# Patient Record
Sex: Male | Born: 1985 | Race: White | Hispanic: No | Marital: Single | Smoking: Current every day smoker
Health system: Southern US, Community
[De-identification: ages and names within clinical notes are randomized; demographics above are authoritative.]

## PROBLEM LIST (undated history)

## (undated) DIAGNOSIS — B019 Varicella without complication: Secondary | ICD-10-CM

## (undated) DIAGNOSIS — F17200 Nicotine dependence, unspecified, uncomplicated: Secondary | ICD-10-CM

## (undated) DIAGNOSIS — F1910 Other psychoactive substance abuse, uncomplicated: Secondary | ICD-10-CM

## (undated) DIAGNOSIS — F319 Bipolar disorder, unspecified: Secondary | ICD-10-CM

## (undated) DIAGNOSIS — R55 Syncope and collapse: Secondary | ICD-10-CM

## (undated) DIAGNOSIS — F329 Major depressive disorder, single episode, unspecified: Secondary | ICD-10-CM

## (undated) DIAGNOSIS — N529 Male erectile dysfunction, unspecified: Secondary | ICD-10-CM

## (undated) DIAGNOSIS — J209 Acute bronchitis, unspecified: Secondary | ICD-10-CM

## (undated) DIAGNOSIS — M549 Dorsalgia, unspecified: Secondary | ICD-10-CM

## (undated) DIAGNOSIS — G8929 Other chronic pain: Secondary | ICD-10-CM

## (undated) DIAGNOSIS — F32A Depression, unspecified: Secondary | ICD-10-CM

## (undated) HISTORY — PX: WISDOM TOOTH EXTRACTION: SHX21

## (undated) HISTORY — DX: Depression, unspecified: F32.A

## (undated) HISTORY — DX: Other psychoactive substance abuse, uncomplicated: F19.10

## (undated) HISTORY — DX: Other chronic pain: G89.29

## (undated) HISTORY — DX: Bipolar disorder, unspecified: F31.9

## (undated) HISTORY — DX: Major depressive disorder, single episode, unspecified: F32.9

## (undated) HISTORY — DX: Male erectile dysfunction, unspecified: N52.9

## (undated) HISTORY — DX: Nicotine dependence, unspecified, uncomplicated: F17.200

## (undated) HISTORY — DX: Varicella without complication: B01.9

## (undated) HISTORY — DX: Syncope and collapse: R55

## (undated) HISTORY — DX: Acute bronchitis, unspecified: J20.9

## (undated) HISTORY — DX: Dorsalgia, unspecified: M54.9

---
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
## 2007-08-10 ENCOUNTER — Emergency Department (HOSPITAL_COMMUNITY): Admission: EM | Admit: 2007-08-10 | Discharge: 2007-08-10 | Payer: Self-pay | Admitting: Family Medicine

## 2008-09-02 ENCOUNTER — Emergency Department (HOSPITAL_COMMUNITY): Admission: EM | Admit: 2008-09-02 | Discharge: 2008-09-03 | Payer: Self-pay | Admitting: Emergency Medicine

## 2009-03-25 ENCOUNTER — Ambulatory Visit: Payer: Self-pay | Admitting: Internal Medicine

## 2009-03-25 DIAGNOSIS — R55 Syncope and collapse: Secondary | ICD-10-CM

## 2009-03-25 DIAGNOSIS — F528 Other sexual dysfunction not due to a substance or known physiological condition: Secondary | ICD-10-CM

## 2009-03-25 DIAGNOSIS — F319 Bipolar disorder, unspecified: Secondary | ICD-10-CM | POA: Insufficient documentation

## 2009-03-25 DIAGNOSIS — F17200 Nicotine dependence, unspecified, uncomplicated: Secondary | ICD-10-CM

## 2009-03-28 ENCOUNTER — Encounter: Payer: Self-pay | Admitting: Internal Medicine

## 2009-04-08 DIAGNOSIS — F329 Major depressive disorder, single episode, unspecified: Secondary | ICD-10-CM

## 2009-04-08 DIAGNOSIS — F489 Nonpsychotic mental disorder, unspecified: Secondary | ICD-10-CM | POA: Insufficient documentation

## 2009-05-05 ENCOUNTER — Ambulatory Visit: Payer: Self-pay | Admitting: Internal Medicine

## 2009-05-18 ENCOUNTER — Ambulatory Visit: Payer: Self-pay

## 2009-05-18 ENCOUNTER — Encounter: Payer: Self-pay | Admitting: Internal Medicine

## 2009-06-30 ENCOUNTER — Encounter: Payer: Self-pay | Admitting: Internal Medicine

## 2009-07-11 ENCOUNTER — Ambulatory Visit: Payer: Self-pay | Admitting: Internal Medicine

## 2009-07-11 ENCOUNTER — Ambulatory Visit: Payer: Self-pay

## 2009-09-08 ENCOUNTER — Ambulatory Visit: Payer: Self-pay | Admitting: Internal Medicine

## 2010-12-31 LAB — CONVERTED CEMR LAB
ALT: 13 units/L (ref 0–53)
AST: 15 units/L (ref 0–37)
Albumin: 4.4 g/dL (ref 3.5–5.2)
Alkaline Phosphatase: 60 units/L (ref 39–117)
BUN: 16 mg/dL (ref 6–23)
Basophils Absolute: 0 10*3/uL (ref 0.0–0.1)
Basophils Relative: 0 % (ref 0.0–3.0)
Bilirubin, Direct: 0.1 mg/dL (ref 0.0–0.3)
CO2: 31 meq/L (ref 19–32)
Calcium: 9.7 mg/dL (ref 8.4–10.5)
Chloride: 108 meq/L (ref 96–112)
Creatinine, Ser: 1.1 mg/dL (ref 0.4–1.5)
Eosinophils Absolute: 0.1 10*3/uL (ref 0.0–0.7)
Eosinophils Relative: 1.7 % (ref 0.0–5.0)
Ferritin: 51.8 ng/mL (ref 22.0–322.0)
Free T4: 0.8 ng/dL (ref 0.6–1.6)
GFR calc non Af Amer: 90.22 mL/min (ref >60)
Glucose, Bld: 99 mg/dL (ref 70–99)
HCT: 45.9 % (ref 39.0–52.0)
Hemoglobin: 15.9 g/dL (ref 13.0–17.0)
Iron: 63 ug/dL (ref 42–165)
Lymphocytes Relative: 26.6 % (ref 12.0–46.0)
Lymphs Abs: 1.8 10*3/uL (ref 0.7–4.0)
MCHC: 34.6 g/dL (ref 30.0–36.0)
MCV: 97.5 fL (ref 78.0–100.0)
Monocytes Absolute: 0.5 10*3/uL (ref 0.1–1.0)
Monocytes Relative: 7.4 % (ref 3.0–12.0)
Neutro Abs: 4.3 10*3/uL (ref 1.4–7.7)
Neutrophils Relative %: 64.3 % (ref 43.0–77.0)
Platelets: 102 10*3/uL — ABNORMAL LOW (ref 150.0–400.0)
Potassium: 4.4 meq/L (ref 3.5–5.1)
Prolactin: 4.3 ng/mL (ref 2.1–17.1)
RBC: 4.71 M/uL (ref 4.22–5.81)
RDW: 13 % (ref 11.5–14.6)
Saturation Ratios: 18.4 % — ABNORMAL LOW (ref 20.0–50.0)
Sex Hormone Binding: 28 nmol/L (ref 13–71)
Sodium: 143 meq/L (ref 135–145)
TSH: 1.28 microintl units/mL (ref 0.35–5.50)
Testosterone Free: 180.7 pg/mL (ref 47.0–244.0)
Testosterone-% Free: 2.5 % (ref 1.6–2.9)
Testosterone: 676.54 ng/dL (ref 350.00–890.00)
Testosterone: 734.92 ng/dL (ref 350–890)
Total Bilirubin: 0.5 mg/dL (ref 0.3–1.2)
Total Protein: 6.4 g/dL (ref 6.0–8.3)
Transferrin: 244.8 mg/dL (ref 212.0–360.0)
WBC: 6.7 10*3/uL (ref 4.5–10.5)

## 2011-02-28 ENCOUNTER — Ambulatory Visit: Payer: Self-pay | Admitting: Internal Medicine

## 2011-02-28 DIAGNOSIS — Z0289 Encounter for other administrative examinations: Secondary | ICD-10-CM

## 2011-04-20 NOTE — H&P (Signed)
NAMEGREGREY, BLOYD                            ACCOUNT NO.:  [id number]   MEDICAL RECORD NO.:  [id number]                   PATIENT TYPE:  INP   LOCATION:  0204                                 FACILITY:  [facility]   PHYSICIAN:  Cindie Crumbly, M.D.               DATE OF BIRTH:  05/20/1986   DATE OF ADMISSION:  12/07/2002  DATE OF DISCHARGE:                         PSYCHIATRIC ADMISSION ASSESSMENT   REASON FOR ADMISSION:  This 25 year old white male was admitted complaining  of depression with suicidal ideation with an attempt to kill himself by  cutting his throat with a knife.   HISTORY OF PRESENT ILLNESS:  The patient complains with increasingly  depressed, irritable and angry mood most of the day nearly everyday over the  past several months, along with anhedonia, giving up on activities  previously found pleasurable, decreased school performance, decreased  concentration, energy level, increases symptoms of fatigue, feelings  hopelessness, helplessness, worthlessness, psychomotor agitation, access ive  and inappropriate guilt, giving up on activities previously enjoyed,  insomnia, decreased appetite, recurrent thoughts of death.  He is unable to  contract for safety at this time.   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  He has a history of being seen when in the forth grade at the [facility] for attention deficit hyperactivity disorder and conduct disorder. He  has had no psychiatric treatment since that time.   DRUG ALCOHOL ABUSE HISTORY:  His drug and alcohol abuse history is  significant for the patient reportedly using alcohol at least once a month.  He reports using cannabis 1-2 times per week since age 35.  He reports  smoking approximately a pack of cigarettes per week.   PAST MEDICAL HISTORY:  Significant for acne vulgaris and a history of mild  scoliosis, which has required no intervention or treatment.   ALLERGIES:  He has a history of being allergic to CECLOR.   CURRENT MEDICATIONS:  He is on no current medications.   STRENGTHS AND ASSETS:  His strengths and assets are that he is well  connected into social services.   FAMILY AND SOCIAL HISTORY:  The patient lives with grandparents most of his  life.  He reports that he is no longer getting along well.  His grandfather  has told him that he hates him and he is not sure that he will be able to  return to his grandparents household.  Mother and father have a history of  bipolar disorder.  Father has a history of polysubstance dependence.  Mother's bipolar disorder is poorly controlled and she was recently  hospitalized at the [facility] for stabilization.  The patient  is currently in 9th grade and doing poorly with his grades.   MENTAL STATUS EXAM:  The patient presents as a well-developed, well-  nourished, adolescent, white male who is alert, oriented x4, psychomotor  agitation, and his appearance is compatible with his stated age.  He speech  is coherent with a decreased rate in volume, speech increase, speech  latency.  He displays no lucency of association, phonemic errors or evidence  of a thought disorder. His concentration is decreased. He displays poor  impulse control.  He remains in denial of his chemical dependency issues.  His affect and mood are depressed, irritable and angry. He is oppositional  and defiant.  His immediate recall short-term memory and remote memory are  intact.  Similarities and differences are within normal limits.  His  proverbs are somewhat concrete and consistent with his educational level and  development. Thought processes are goal directed.   DIAGNOSES:  His diagnoses according to DSM-IV:   AXIS I:  1. Major depression, single episode, severe without psychosis.  2. Rule out substance abuse and mood disorder.  3.  Polysubstance dependence.  4. Conduct disorder.  5. Attention deficit hyperactivity disorder, combined type.   AXIS II:  1. Rule out learning disorder, not otherwise specified.  2. Rule out personality disorder, not otherwise specified.   AXIS III:  1. Acne vulgaris.  2. Mild scoliosis.   AXIS IV:  Current psychosocial stressors are severe.   AXIS V:  Code 20.   FURTHER EVALUATION AND TREATMENT RECOMMENDATIONS:  The estimated length of  stay for the patient on the inpatient unit is 5-7 days.  Initial discharge  plan is to discharge patient to home.  Initial plan of care is begin the  patient on a trial of Wellbutrin XL.  Psychotherapy will focus on improving  the patient's impulse control, decrease in cognitive distortions and  potential for self-harm.  A laboratory work-up will also be initiated to  rule out any other medical problems contributing to his symptomatology.                                                   Cindie Crumbly, M.D.    TS/MEDQ  D:  12/08/2002  T:  12/08/2002  Job:  [id number]

## 2011-04-20 NOTE — Discharge Summary (Signed)
NAMEHOWELL, Alfred Woodard                            ACCOUNT NO.:  [id number]   MEDICAL RECORD NO.:  [id number]                   PATIENT TYPE:  INP   LOCATION:  0204                                 FACILITY:  [facility]   PHYSICIAN:  Cindie Crumbly, M.D.               DATE OF BIRTH:  1986-04-03   DATE OF ADMISSION:  12/07/2002  DATE OF DISCHARGE:                                 DISCHARGE SUMMARY   REASON FOR ADMISSION:  This 25 year old white male was admitted complaining  of depression with suicidal ideation with an attempt to kill himself by  cutting his throat with a knife.  For further history of present illness,  please see the patient's psychiatry admission assessment.   PHYSICAL EXAMINATION:  At the time of admission was significant for acne  vulgaris and mild scoliosis which required no intervention.  He had an  otherwise unremarkable physical examination.   LABORATORY EXAMINATION:  The patient underwent a laboratory workup to rule  out any other medical problems contributing to his symptomatology.  Urine  drug screen was significant for metabolites of marijuana and was otherwise  negative.  A urine probe for gonorrhea and chlamydia were negative.  CBC was  unremarkable.  A UA was unremarkable.  The patient received no x-rays, no  special procedures, no additional consultations.  He sustained no  complications during the course of this hospitalization.   HOSPITAL COURSE:  On admission, the patient was psychomotor agitated with  decreased concentration, showed poor impulse control.  He remained in denial  of his chemical dependency issues.  His affect and mood were depressed,  irritable and angry.  He was oppositional and defiant.  He gradually began  addressing some of his chemical dependency issues in psychotherapy.  He was  begun on a trial of Wellbutrin XL and tolerated this medication well without  side effects.  At the time of discharge, he denies any homicidal or suicidal  ideation.  His affect and mood have improved.  His concentration has  increased.  He remains in denial of his chemical dependency problem but his  affect and mood have improved.  He no longer appears to be a danger to  himself or others, is motivated for outpatient therapy  and consequently is  felt to have reached his maximum benefits of hospitalization and is ready  for discharge to a less restricted alternative setting.   CONDITION ON DISCHARGE:  Improved.   DISCHARGE DIAGNOSES:   AXIS I:  1. Major depression, single episode, severe, without psychosis.  2. Rule out substance-induced mood disorder.  3. Polysubstance dependence.  4. Conduct disorder.  5. Attention deficit hyperactivity disorder, combined type.   AXIS II:  1. Rule out learning disorder not otherwise specified.  2. Rule out personality disorder not otherwise specified.   AXIS III:  1. Acne vulgaris.  2. Mild scoliosis.   AXIS IV:  Current  psychosocial stressors are severe.   AXIS V:  Code 20 on admission, code 30 on discharge.   FURTHER EVALUATION AND TREATMENT RECOMMENDATIONS:  1. The patient is discharged to home.  2. He is discharged on an unrestricted level of activity and a regular diet.  3. He will follow up with Dr. Mariana Single, his outpatient psychiatrist at [facility] for all further aspects of his psychiatric     care and consequently I will sign off on the case at this time.  He will     follow up with his primary care physician for all further aspects of his     medical care.   DISCHARGE MEDICATIONS:  Wellbutrin XL 150 mg p.o. q.a.m.                                                 Cindie Crumbly, M.D.    TS/MEDQ  D:  12/15/2002  T:  12/15/2002  Job:  [id number]

## 2011-08-12 ENCOUNTER — Emergency Department (HOSPITAL_COMMUNITY)
Admission: EM | Admit: 2011-08-12 | Discharge: 2011-08-13 | Disposition: A | Discharge status: Home / Self Care | Payer: BC Managed Care – PPO | Source: Home / Self Care | Attending: Emergency Medicine | Admitting: Emergency Medicine

## 2011-08-13 ENCOUNTER — Emergency Department (HOSPITAL_COMMUNITY)
Admit: 2011-08-13 | Discharge: 2011-08-13 | Disposition: A | Discharge status: Home / Self Care | Payer: BC Managed Care – PPO | Attending: Emergency Medicine | Admitting: Emergency Medicine

## 2011-08-13 DIAGNOSIS — S139XXA Sprain of joints and ligaments of unspecified parts of neck, initial encounter: Secondary | ICD-10-CM | POA: Insufficient documentation

## 2011-08-13 DIAGNOSIS — M542 Cervicalgia: Secondary | ICD-10-CM | POA: Insufficient documentation

## 2011-08-13 DIAGNOSIS — F411 Generalized anxiety disorder: Secondary | ICD-10-CM | POA: Insufficient documentation

## 2011-08-13 DIAGNOSIS — IMO0002 Reserved for concepts with insufficient information to code with codable children: Secondary | ICD-10-CM | POA: Insufficient documentation

## 2011-08-13 DIAGNOSIS — G8929 Other chronic pain: Secondary | ICD-10-CM | POA: Insufficient documentation

## 2011-08-13 DIAGNOSIS — M549 Dorsalgia, unspecified: Secondary | ICD-10-CM | POA: Insufficient documentation

## 2011-09-03 LAB — RAPID URINE DRUG SCREEN, HOSP PERFORMED
Benzodiazepines: NOT DETECTED
Cocaine: NOT DETECTED
Opiates: NOT DETECTED
Tetrahydrocannabinol: POSITIVE — AB

## 2011-09-03 LAB — COMPREHENSIVE METABOLIC PANEL
ALT: 13
AST: 20
Albumin: 4.6
Alkaline Phosphatase: 61
Chloride: 106
GFR calc Af Amer: >60
Potassium: 3.9
Sodium: 140
Total Bilirubin: 0.6
Total Protein: 6.9

## 2011-09-03 LAB — DIFFERENTIAL
Basophils Absolute: 0
Basophils Relative: 0
Eosinophils Relative: 1
Monocytes Absolute: 0.5
Monocytes Relative: 7

## 2011-09-03 LAB — URINALYSIS, ROUTINE W REFLEX MICROSCOPIC
Hgb urine dipstick: NEGATIVE
Ketones, ur: NEGATIVE
Nitrite: NEGATIVE
Urobilinogen, UA: 0.2

## 2011-09-03 LAB — CBC
HCT: 46.1
Platelets: 155
WBC: 7.5

## 2011-09-03 LAB — URINE MICROSCOPIC-ADD ON

## 2011-09-03 LAB — ETHANOL: Alcohol, Ethyl (B): 40 — ABNORMAL HIGH

## 2012-02-13 ENCOUNTER — Ambulatory Visit: Payer: BC Managed Care – PPO | Admitting: Family Medicine

## 2012-02-13 VITALS — BP 123/72 | HR 108 | Temp 97.9°F | Resp 18 | Ht 71.25 in | Wt 147.4 lb

## 2012-02-13 DIAGNOSIS — M549 Dorsalgia, unspecified: Secondary | ICD-10-CM

## 2012-02-13 DIAGNOSIS — G8929 Other chronic pain: Secondary | ICD-10-CM

## 2012-02-13 DIAGNOSIS — J40 Bronchitis, not specified as acute or chronic: Secondary | ICD-10-CM

## 2012-02-13 MED ORDER — DOXYCYCLINE HYCLATE 100 MG PO TABS: 100.0000 mg | ORAL_TABLET | Freq: Two times a day (BID) | ORAL | Status: AC

## 2012-02-13 MED ORDER — CYCLOBENZAPRINE HCL 10 MG PO TABS: ORAL_TABLET | ORAL | Status: DC

## 2012-02-13 MED ORDER — HYDROCODONE-HOMATROPINE 5-1.5 MG/5ML PO SYRP: 5.0000 mL | ORAL_SOLUTION | Freq: Three times a day (TID) | ORAL | Status: AC | PRN

## 2012-02-13 NOTE — Patient Instructions (Signed)
See. back exercise booklet.  Treatment planning of fluids, get enough rest. Stop smoking.

## 2012-02-13 NOTE — Progress Notes (Signed)
Subjective: This patient has had a respiratory infection for over 6 days. The last couple of days at work he's been coughing. Yesterday the cough was worse to terrible, too he almost passed out. He did better overnight last night and when he got back into work today. There is a lot of metal fragments and dust in the air where he works. His boss told him to come see the doctor.  The cough has also lost passing in the muscles of his back. However he has had a several year history of hurting his back, mid back, and he wondered whether he had anything out of alignment.  Objective: TMs normal. Throat clear. Neck supple without nodes. Chest clear to auscultation. Heart regular without murmurs. Spine looks fine. Normal flexion and extension and rotation.  Assessment: Arthritis Back strain, chronic  Plans antibiotic, cough medicine, and muscle relaxants.

## 2012-07-11 ENCOUNTER — Encounter (HOSPITAL_COMMUNITY): Payer: Self-pay

## 2012-07-11 ENCOUNTER — Emergency Department (INDEPENDENT_AMBULATORY_CARE_PROVIDER_SITE_OTHER)
Admission: EM | Admit: 2012-07-11 | Discharge: 2012-07-11 | Disposition: A | Discharge status: Home / Self Care | Payer: BC Managed Care – PPO | Source: Home / Self Care | Attending: Family Medicine | Admitting: Family Medicine

## 2012-07-11 DIAGNOSIS — F411 Generalized anxiety disorder: Secondary | ICD-10-CM

## 2012-07-11 MED ORDER — CLONAZEPAM 1 MG PO TABS: 1.0000 mg | ORAL_TABLET | Freq: Two times a day (BID) | ORAL | Status: DC | PRN

## 2012-07-11 MED ORDER — PANTOPRAZOLE SODIUM 20 MG PO TBEC: 20.0000 mg | DELAYED_RELEASE_TABLET | Freq: Every day | ORAL | Status: DC

## 2012-07-11 NOTE — ED Notes (Signed)
Patient c/o 1 week duration of anorexia,nausea ,vomiting, upper back , insomnia, neck pain and HA, reportedly under a lot of stress recently due to an unplanned change of address; has reportedly still been able to tolerate PO fluids and has been consuming a reported 40 ounce beer QOD, using pepto bismol and bedrest for home treatment Off of his vyvance for ~ 2 weeks

## 2012-07-11 NOTE — Discharge Instructions (Signed)
Use medicine as needed and go to mental health clinic if further problems.

## 2012-07-11 NOTE — ED Provider Notes (Signed)
History     CSN: [id number]  Arrival date & time 07/11/12  1155   First MD Initiated Contact with Patient 07/11/12 1256      Chief Complaint  Patient presents with  . Nausea    (Consider location/radiation/quality/duration/timing/severity/associated sxs/prior treatment) Patient is a 26 y.o. male presenting with mental health disorder. The history is provided by the patient.  Mental Health Problem The primary symptoms include dysphoric mood, bizarre behavior and somatic symptoms. The current episode started 1 to 2 weeks ago. This is a new problem.  The onset of the illness is precipitated by a stressful event. The degree of incapacity that he is experiencing as a consequence of his illness is moderate. Additional symptoms of the illness include insomnia, appetite change, unexpected weight change and agitation. He does not admit to suicidal ideas. He does not contemplate harming himself. He has not already injured self.    Past Medical History  Diagnosis Date  . Erectile dysfunction   . Tobacco use disorder   . Bipolar disorder, unspecified   . Syncope and collapse   . Depression     suicide attempt in 2004, psychiatric admis  . Chronic back pain   . Polysubstance abuse     Past Surgical History  Procedure Date  . Wisdom tooth extraction     Family History  Problem Relation Age of Onset  . Anxiety disorder Mother   . Bipolar disorder Father   . Schizophrenia Father     History  Substance Use Topics  . Smoking status: Current Everyday Smoker -- 1.5 packs/day  . Smokeless tobacco: Not on file  . Alcohol Use: Yes      Review of Systems  Constitutional: Positive for appetite change and unexpected weight change.  HENT: Negative.   Gastrointestinal: Positive for nausea. Negative for vomiting.  Psychiatric/Behavioral: Positive for disturbed wake/sleep cycle, dysphoric mood and agitation. The patient is nervous/anxious and has insomnia.     Allergies   Cefaclor  Home Medications   Current Outpatient Rx  Name Route Sig Dispense Refill  . CLONAZEPAM 1 MG PO TABS Oral Take 1 tablet (1 mg total) by mouth 2 (two) times daily as needed for anxiety. 30 tablet 0  . CYCLOBENZAPRINE HCL 10 MG PO TABS  Take one at bedtime for muscle relaxant. 20 tablet 1  . LISDEXAMFETAMINE DIMESYLATE 40 MG PO CAPS Oral Take 40 mg by mouth every morning.    Marland Kitchen PANTOPRAZOLE SODIUM 20 MG PO TBEC Oral Take 1 tablet (20 mg total) by mouth daily. 30 tablet 0    BP 133/83  Pulse 94  Temp 98.2 F (36.8 C) (Oral)  Resp 16  SpO2 100%  Physical Exam  Nursing note and vitals reviewed. Constitutional: He is oriented to person, place, and time. He appears well-developed and well-nourished.  Neck: Normal range of motion. Neck supple.  Cardiovascular: Normal rate and regular rhythm.   Pulmonary/Chest: Breath sounds normal.  Abdominal: Soft. Bowel sounds are normal. There is no tenderness.  Lymphadenopathy:    He has no cervical adenopathy.  Neurological: He is alert and oriented to person, place, and time.  Skin: Skin is warm and dry.  Psychiatric: Judgment and thought content normal. His mood appears anxious. Cognition and memory are normal.    ED Course  Procedures (including critical care time)  Labs Reviewed - No data to display Ct Head Wo Contrast  07/13/2012  *RADIOLOGY REPORT*  Clinical Data: Altered mental status, possible overdose.  CT HEAD WITHOUT CONTRAST  Technique:  Contiguous axial images were obtained from the base of the skull through the vertex without contrast.  Comparison: None.  Findings: There is no evidence of acute intracranial hemorrhage, brain edema, mass lesion, acute infarction,   mass effect, or midline shift. Acute infarct may be inapparent on noncontrast CT. No other intra-axial abnormalities are seen, and the ventricles and sulci are within normal limits in size and symmetry.   No abnormal extra-axial fluid collections or masses are  identified.  No significant calvarial abnormality.  IMPRESSION: 1. Negative for bleed or other acute intracranial process.  Original Report Authenticated By: Osa Craver, M.D.     1. Generalized anxiety disorder       MDM          Linna Hoff, MD 07/14/12 2013

## 2012-07-13 ENCOUNTER — Other Ambulatory Visit: Payer: Self-pay

## 2012-07-13 ENCOUNTER — Encounter (HOSPITAL_COMMUNITY): Payer: Self-pay | Admitting: *Deleted

## 2012-07-13 ENCOUNTER — Inpatient Hospital Stay (HOSPITAL_COMMUNITY)
Admission: EM | Admit: 2012-07-13 | Discharge: 2012-07-13 | DRG: 449 | Disposition: A | Discharge status: Home / Self Care | Payer: BC Managed Care – PPO | Attending: Emergency Medicine | Admitting: Emergency Medicine

## 2012-07-13 ENCOUNTER — Emergency Department (HOSPITAL_COMMUNITY): Payer: BC Managed Care – PPO

## 2012-07-13 DIAGNOSIS — T43591A Poisoning by other antipsychotics and neuroleptics, accidental (unintentional), initial encounter: Secondary | ICD-10-CM | POA: Diagnosis present

## 2012-07-13 DIAGNOSIS — T50901A Poisoning by unspecified drugs, medicaments and biological substances, accidental (unintentional), initial encounter: Secondary | ICD-10-CM | POA: Diagnosis present

## 2012-07-13 DIAGNOSIS — D696 Thrombocytopenia, unspecified: Secondary | ICD-10-CM | POA: Diagnosis present

## 2012-07-13 DIAGNOSIS — Z888 Allergy status to other drugs, medicaments and biological substances status: Secondary | ICD-10-CM

## 2012-07-13 DIAGNOSIS — R4182 Altered mental status, unspecified: Secondary | ICD-10-CM

## 2012-07-13 DIAGNOSIS — F1910 Other psychoactive substance abuse, uncomplicated: Secondary | ICD-10-CM | POA: Diagnosis present

## 2012-07-13 DIAGNOSIS — R5383 Other fatigue: Secondary | ICD-10-CM | POA: Diagnosis present

## 2012-07-13 DIAGNOSIS — M549 Dorsalgia, unspecified: Secondary | ICD-10-CM | POA: Diagnosis present

## 2012-07-13 DIAGNOSIS — T424X4A Poisoning by benzodiazepines, undetermined, initial encounter: Principal | ICD-10-CM | POA: Diagnosis present

## 2012-07-13 DIAGNOSIS — F10929 Alcohol use, unspecified with intoxication, unspecified: Secondary | ICD-10-CM | POA: Diagnosis present

## 2012-07-13 DIAGNOSIS — F1410 Cocaine abuse, uncomplicated: Secondary | ICD-10-CM | POA: Diagnosis present

## 2012-07-13 DIAGNOSIS — F489 Nonpsychotic mental disorder, unspecified: Secondary | ICD-10-CM | POA: Diagnosis present

## 2012-07-13 DIAGNOSIS — F1210 Cannabis abuse, uncomplicated: Secondary | ICD-10-CM | POA: Diagnosis present

## 2012-07-13 DIAGNOSIS — R5381 Other malaise: Secondary | ICD-10-CM | POA: Diagnosis present

## 2012-07-13 DIAGNOSIS — F1010 Alcohol abuse, uncomplicated: Secondary | ICD-10-CM | POA: Diagnosis present

## 2012-07-13 DIAGNOSIS — G8929 Other chronic pain: Secondary | ICD-10-CM | POA: Diagnosis present

## 2012-07-13 DIAGNOSIS — Y92009 Unspecified place in unspecified non-institutional (private) residence as the place of occurrence of the external cause: Secondary | ICD-10-CM

## 2012-07-13 DIAGNOSIS — F1510 Other stimulant abuse, uncomplicated: Secondary | ICD-10-CM | POA: Diagnosis present

## 2012-07-13 DIAGNOSIS — F17200 Nicotine dependence, unspecified, uncomplicated: Secondary | ICD-10-CM | POA: Diagnosis present

## 2012-07-13 DIAGNOSIS — F319 Bipolar disorder, unspecified: Secondary | ICD-10-CM | POA: Diagnosis present

## 2012-07-13 DIAGNOSIS — F909 Attention-deficit hyperactivity disorder, unspecified type: Secondary | ICD-10-CM | POA: Diagnosis present

## 2012-07-13 DIAGNOSIS — N529 Male erectile dysfunction, unspecified: Secondary | ICD-10-CM | POA: Diagnosis present

## 2012-07-13 LAB — RAPID URINE DRUG SCREEN, HOSP PERFORMED
Amphetamines: NOT DETECTED
Benzodiazepines: NOT DETECTED
Cocaine: NOT DETECTED
Opiates: NOT DETECTED

## 2012-07-13 LAB — COMPREHENSIVE METABOLIC PANEL
ALT: 10 U/L (ref 0–53)
AST: 11 U/L (ref 0–37)
Albumin: 3.6 g/dL (ref 3.5–5.2)
CO2: 25 mEq/L (ref 19–32)
Calcium: 8.5 mg/dL (ref 8.4–10.5)
Creatinine, Ser: 0.8 mg/dL (ref 0.50–1.35)
Sodium: 140 mEq/L (ref 135–145)
Total Protein: 6 g/dL (ref 6.0–8.3)

## 2012-07-13 LAB — CBC WITH DIFFERENTIAL/PLATELET
Basophils Absolute: 0 10*3/uL (ref 0.0–0.1)
Basophils Relative: 0 % (ref 0–1)
Eosinophils Relative: 5 % (ref 0–5)
HCT: 41.9 % (ref 39.0–52.0)
Lymphocytes Relative: 33 % (ref 12–46)
MCHC: 34.4 g/dL (ref 30.0–36.0)
MCV: 97 fL (ref 78.0–100.0)
Monocytes Absolute: 0.4 10*3/uL (ref 0.1–1.0)
Platelets: 110 10*3/uL — ABNORMAL LOW (ref 150–400)
RDW: 13.1 % (ref 11.5–15.5)
WBC: 5 10*3/uL (ref 4.0–10.5)

## 2012-07-13 LAB — ACETAMINOPHEN LEVEL: Acetaminophen (Tylenol), Serum: <15 ug/mL (ref 10–30)

## 2012-07-13 LAB — SALICYLATE LEVEL: Salicylate Lvl: <2 mg/dL — ABNORMAL LOW (ref 2.8–20.0)

## 2012-07-13 LAB — GLUCOSE, CAPILLARY: Glucose-Capillary: 73 mg/dL (ref 70–99)

## 2012-07-13 MED ORDER — SODIUM BICARBONATE 8.4 % IV SOLN
INTRAVENOUS | Status: AC
  Administered 2012-07-13: 20:00:00
  Filled 2012-07-13: qty 50

## 2012-07-13 MED ORDER — DEXTROSE 50 % IV SOLN
INTRAVENOUS | Status: AC
  Administered 2012-07-13: 20:00:00
  Filled 2012-07-13: qty 100

## 2012-07-13 MED ORDER — NALOXONE HCL 0.4 MG/ML IJ SOLN
INTRAMUSCULAR | Status: AC
  Administered 2012-07-13: 0.4 mg
  Filled 2012-07-13: qty 3

## 2012-07-13 MED ORDER — DEXTROSE 50 % IV SOLN
INTRAVENOUS | Status: AC
  Administered 2012-07-13: 22:00:00
  Filled 2012-07-13: qty 50

## 2012-07-13 NOTE — ED Notes (Signed)
WUJ:WJXB<JY> Expected date:07/13/12<BR> Expected time: 7:20 PM<BR> Means of arrival:Ambulance<BR> Comments:<BR> OD

## 2012-07-13 NOTE — ED Notes (Signed)
Per EMS: patient was at bar earlier today and was picked up by friends. Pt admits to drinking ETOH and taking methadone. Rx's filled 2 days ago with less  Than half of his rx left of flexeril and klonipin. Sts that he took a methadone that he received from at the bar, unknown dosage. Patient maintaining his airway at this time. Received 4 narcan en route, as well as D50 and amp of bicarb.

## 2012-07-13 NOTE — H&P (Signed)
[facility] History and Physical  Alfred Woodard ZOX:[id number] DOB: Dec 31, 1985 DOA: 07/13/2012  Referring physician: Chaney Malling PCP: No primary provider on file.   Chief Complaint: Lethargy  HPI:  Alfred Woodard is 26 year old Caucasian male with past medical history of bipolar disorder, depression with suicide attempt in 2004, has history also of polysubstance abuse. Patient brought to the emergency department by EMS because of lethargy. Patient is lethargic now but he is easy to arouse with external rub and he engages in conversation, with attention span of about 30 seconds and he falls back to sleep. History was obtained from the ED physician notes, and the patient himself. Patient was in the emergency 2 days ago complaining about feeling well and anxiety state, he got prescription for 30 pills of Klonopin, showing said he took some of his pills last night, he doesn't remember how many. There was question also about taking some Flexeril and methadone he got off of the street. but the bottle misses about 24 pills, he said also he drank about 4 bottles of 40 fluid ounce beer bottles. In the emergency department is extremely lethargic, but he can protect his airways, surprisingly his UDS came back negative for benzodiazepines and narcotics. His blood alcohol level is 68. ACT team was being consulted in the emergency department to determine about the disposition of the patient.  Review of Systems:  Unobtainable secondary to her lethargy  Past Medical History  Diagnosis Date  . Erectile dysfunction   . Tobacco use disorder   . Bipolar disorder, unspecified   . Syncope and collapse   . Depression     suicide attempt in 2004, psychiatric admis  . Chronic back pain   . Polysubstance abuse    Past Surgical History  Procedure Date  . Wisdom tooth extraction    Social History:  reports that he has been smoking.  He does not have any smokeless tobacco history on file. He reports that he drinks  alcohol. He reports that he uses illicit drugs (Marijuana, Cocaine, and MDMA (Ecstacy)).  Allergies  Allergen Reactions  . Cefaclor Other (See Comments)    childhood    Family History  Problem Relation Age of Onset  . Anxiety disorder Mother   . Bipolar disorder Father   . Schizophrenia Father     Prior to Admission medications   Medication Sig Start Date End Date Taking? Authorizing Provider  clonazePAM (KLONOPIN) 1 MG tablet Take 1 tablet (1 mg total) by mouth 2 (two) times daily as needed for anxiety. 07/11/12 08/10/12 Yes Linna Hoff, MD  cyclobenzaprine (FLEXERIL) 10 MG tablet Take one at bedtime for muscle relaxant. 02/13/12  Yes Peyton Najjar, MD  lisdexamfetamine (VYVANSE) 40 MG capsule Take 40 mg by mouth every morning.   Yes Historical Provider, MD  pantoprazole (PROTONIX) 20 MG tablet Take 1 tablet (20 mg total) by mouth daily. 07/11/12 07/11/13 Yes Linna Hoff, MD   Physical Exam: Filed Vitals:   07/13/12 2100 07/13/12 2115 07/13/12 2130 07/13/12 2200  BP: 96/59 102/61 102/64 135/82  Pulse: 78 71 71 86  Temp:      TempSrc:      Resp: 16 18 16 18   SpO2: 99% 99% 100% 99%   General appearance: Lethargic, easy to arouse Head: Normocephalic, without obvious abnormality, atraumatic  Eyes: conjunctivae/corneas clear. PERRL, EOM's intact. Fundi benign.  Nose: Nares normal. Septum midline. Mucosa normal. No drainage or sinus tenderness.  Throat: lips, mucosa, and tongue normal; teeth and  gums normal  Neck: Supple, no masses, no cervical lymphadenopathy, no JVD appreciated, no meningeal signs Resp: clear to auscultation bilaterally  Chest wall: no tenderness  Cardio: regular rate and rhythm, S1, S2 normal, no murmur, click, rub or gallop  GI: soft, non-tender; bowel sounds normal; no masses, no organomegaly  Extremities: extremities normal, atraumatic, no cyanosis or edema  Skin: Skin color, texture, turgor normal. No rashes or lesions  Neurologic: Alert and oriented X 3,  normal strength and tone. Normal symmetric reflexes. Normal coordination and gait  Labs on Admission:  Basic Metabolic Panel:  Lab 07/13/12 1610  NA 140  K 3.5  CL 104  CO2 25  GLUCOSE 79  BUN 8  CREATININE 0.80  CALCIUM 8.5  MG --  PHOS --   Liver Function Tests:  Lab 07/13/12 2025  AST 11  ALT 10  ALKPHOS 50  BILITOT 0.2*  PROT 6.0  ALBUMIN 3.6   No results found for this basename: LIPASE:5,AMYLASE:5 in the last 168 hours No results found for this basename: AMMONIA:5 in the last 168 hours CBC:  Lab 07/13/12 2025  WBC 5.0  NEUTROABS 2.7  HGB 14.4  HCT 41.9  MCV 97.0  PLT 110*   Cardiac Enzymes: No results found for this basename: CKTOTAL:5,CKMB:5,CKMBINDEX:5,TROPONINI:5 in the last 168 hours  BNP (last 3 results) No results found for this basename: PROBNP:3 in the last 8760 hours CBG:  Lab 07/13/12 2119  GLUCAP 73    Radiological Exams on Admission: Ct Head Wo Contrast  07/13/2012  *RADIOLOGY REPORT*  Clinical Data: Altered mental status, possible overdose.  CT HEAD WITHOUT CONTRAST  Technique:  Contiguous axial images were obtained from the base of the skull through the vertex without contrast.  Comparison: None.  Findings: There is no evidence of acute intracranial hemorrhage, brain edema, mass lesion, acute infarction,   mass effect, or midline shift. Acute infarct may be inapparent on noncontrast CT. No other intra-axial abnormalities are seen, and the ventricles and sulci are within normal limits in size and symmetry.   No abnormal extra-axial fluid collections or masses are identified.  No significant calvarial abnormality.  IMPRESSION: 1. Negative for bleed or other acute intracranial process.  Original Report Authenticated By: Osa Craver, M.D.    EKG: Independently reviewed.   Assessment/Plan Principal Problem:  *Lethargy Active Problems:  BIPOLAR DISORDER UNSPECIFIED  SUICIDE RISK  Thrombocytopenia  Alcohol intoxication   Polysubstance abuse  Drug overdose   Lethargy Obviously this improved since patient presents to the emergency department, but she still very sleepy and as I mentioned above has attention span of about 30 seconds. This is likely secondary to both alcohol intoxication and a questionable drug overdose. Plan is to observe the patient regains full alertness and weakness.  Drug overdose, questionable Patient reported that he drank some alcohol last night and he took Klonopin he did not remember how many pills, he also mentioned to the ED physician that he took Flexeril and methadone he got off of the street. As mentioned above his urine drug screen is negative for benzodiazepines and narcotics. Its really hard to rule out suicidal intention/attempt with this questionable overdose. ACT team consulted in the emergency department.  Alcohol intoxication Blood alcohol level is 68, patient tension he drank about 160 fluid ounce of beer last night, I think personally this is probably the cause of his lethargy. He denies been a regular drinker, denies any history of alcohol withdrawal.  Thrombocytopenia Stable, patient has thrombocytopenia  and blood work up done in April 2010, seems to be chronic stable problem.  ADHD Patient reported that he is taken taking Vynase for ADHD, there was no bottle with him.   Code Status: Full Family Communication:  Disposition Plan: Telemetry, ACT team to decide about disposition regarding intentional drug overdose  Time spent: 60 minutes  [facility] Pager [phone]  If 7PM-7AM, please contact night-coverage [email or web address] Password [email or web address] 07/13/2012, 10:59 PM

## 2012-07-13 NOTE — ED Notes (Signed)
Admitting MD at bedside.

## 2012-07-13 NOTE — ED Notes (Signed)
Poison control called to check on patient status.

## 2012-07-13 NOTE — ED Provider Notes (Signed)
History     CSN: [id number]  Arrival date & time 07/13/12  1931   First MD Initiated Contact with Patient 07/13/12 1943      Chief Complaint  Patient presents with  . Drug Overdose    (Consider location/radiation/quality/duration/timing/severity/associated sxs/prior treatment) HPI Comments: Alfred Woodard is a 26 y.o. Male hx of polysubstance abuse here with clonopin and flexeril overdose. He said that he took his prescribed clonopin and flexeril today. About 20 pills of clonopin and flexeril were missing from his bottles. He said that "the government made me take them". He denies suicidal ideation. She also took some methadone and has been drinking alcohol. EMS gave him 4mg  narcan and the nurse gave him 4mg  narcan prior to my interview but he was still somnolent. Patient very lethargic so unable to answer many questions. Denies SOB/CP/ab pain.   Level V caveat- altered mental status    The history is provided by the patient. The history is limited by the condition of the patient.    Past Medical History  Diagnosis Date  . Erectile dysfunction   . Tobacco use disorder   . Bipolar disorder, unspecified   . Syncope and collapse   . Depression     suicide attempt in 2004, psychiatric admis  . Chronic back pain   . Polysubstance abuse     Past Surgical History  Procedure Date  . Wisdom tooth extraction     Family History  Problem Relation Age of Onset  . Anxiety disorder Mother   . Bipolar disorder Father   . Schizophrenia Father     History  Substance Use Topics  . Smoking status: Current Everyday Smoker -- 1.5 packs/day  . Smokeless tobacco: Not on file  . Alcohol Use: Yes      Review of Systems  Unable to perform ROS: Mental status change    Allergies  Cefaclor  Home Medications   Current Outpatient Rx  Name Route Sig Dispense Refill  . CLONAZEPAM 1 MG PO TABS Oral Take 1 tablet (1 mg total) by mouth 2 (two) times daily as needed for anxiety. 30 tablet  0  . CYCLOBENZAPRINE HCL 10 MG PO TABS  Take one at bedtime for muscle relaxant. 20 tablet 1  . LISDEXAMFETAMINE DIMESYLATE 40 MG PO CAPS Oral Take 40 mg by mouth every morning.    Marland Kitchen PANTOPRAZOLE SODIUM 20 MG PO TBEC Oral Take 1 tablet (20 mg total) by mouth daily. 30 tablet 0    BP 135/82  Pulse 86  Temp 97.9 F (36.6 C) (Oral)  Resp 18  SpO2 99%  Physical Exam  Nursing note and vitals reviewed. Constitutional:       Thin, lethargic but wakes up to answer questions. No drooling, tolerating secretions.   HENT:  Head: Normocephalic.  Eyes: Conjunctivae are normal. Pupils are equal, round, and reactive to light.  Neck: Normal range of motion. Neck supple.  Cardiovascular: Normal rate and regular rhythm.   Pulmonary/Chest: Effort normal and breath sounds normal.  Abdominal: Soft. Bowel sounds are normal.  Musculoskeletal:       Moving all extremities  Neurological:       Lethargic, wakes up occasionally.   Skin: Skin is warm.  Psychiatric:       Unable to assess    ED Course  Procedures (including critical care time)  Labs Reviewed  CBC WITH DIFFERENTIAL - Abnormal; Notable for the following:    Platelets 110 (*)     All other  components within normal limits  COMPREHENSIVE METABOLIC PANEL - Abnormal; Notable for the following:    Total Bilirubin 0.2 (*)     All other components within normal limits  ETHANOL - Abnormal; Notable for the following:    Alcohol, Ethyl (B) 68 (*)     All other components within normal limits  SALICYLATE LEVEL - Abnormal; Notable for the following:    Salicylate Lvl <2.0 (*)     All other components within normal limits  ACETAMINOPHEN LEVEL  URINE RAPID DRUG SCREEN (HOSP PERFORMED)  GLUCOSE, CAPILLARY   Ct Head Wo Contrast  07/13/2012  *RADIOLOGY REPORT*  Clinical Data: Altered mental status, possible overdose.  CT HEAD WITHOUT CONTRAST  Technique:  Contiguous axial images were obtained from the base of the skull through the vertex without  contrast.  Comparison: None.  Findings: There is no evidence of acute intracranial hemorrhage, brain edema, mass lesion, acute infarction,   mass effect, or midline shift. Acute infarct may be inapparent on noncontrast CT. No other intra-axial abnormalities are seen, and the ventricles and sulci are within normal limits in size and symmetry.   No abnormal extra-axial fluid collections or masses are identified.  No significant calvarial abnormality.  IMPRESSION: 1. Negative for bleed or other acute intracranial process.  Original Report Authenticated By: Osa Craver, M.D.     No diagnosis found.   Date: 07/13/2012  Rate: 85  Rhythm: normal sinus rhythm  QRS Axis: normal  Intervals: normal  ST/T Wave abnormalities: normal  Conduction Disutrbances:none  Narrative Interpretation:   Old EKG Reviewed: unchanged    MDM  Alfred Woodard is a 26 y.o. male with clonopin and flexeril overdose. Nurse called poison control, who recommend that we observe him for mental status changes. His alcohol level is 68 and salicylate and tylenol level negative. Will admit to inpatient for observation.   10:40 PM Discussed with Dr. Arthor Captain, hospitalist, who accepted the patient on tele. Patient still lethargic but now more responsive.        Richardean Canal, MD 07/13/12 2252
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
## 2012-08-01 ENCOUNTER — Ambulatory Visit: Payer: BC Managed Care – PPO | Admitting: Internal Medicine

## 2014-01-22 ENCOUNTER — Encounter (HOSPITAL_COMMUNITY): Payer: Self-pay | Admitting: Emergency Medicine

## 2014-01-22 ENCOUNTER — Emergency Department (INDEPENDENT_AMBULATORY_CARE_PROVIDER_SITE_OTHER): Payer: Self-pay

## 2014-01-22 ENCOUNTER — Emergency Department (INDEPENDENT_AMBULATORY_CARE_PROVIDER_SITE_OTHER)
Admission: EM | Admit: 2014-01-22 | Discharge: 2014-01-22 | Disposition: A | Discharge status: Home / Self Care | Payer: Self-pay | Source: Home / Self Care | Attending: Family Medicine | Admitting: Family Medicine

## 2014-01-22 DIAGNOSIS — M549 Dorsalgia, unspecified: Secondary | ICD-10-CM

## 2014-01-22 MED ORDER — METHYLPREDNISOLONE ACETATE 80 MG/ML IJ SUSP
INTRAMUSCULAR | Status: AC
  Filled 2014-01-22: qty 1

## 2014-01-22 MED ORDER — DICLOFENAC SODIUM 75 MG PO TBEC: 75.0000 mg | DELAYED_RELEASE_TABLET | Freq: Two times a day (BID) | ORAL | Status: DC

## 2014-01-22 MED ORDER — METHYLPREDNISOLONE ACETATE 40 MG/ML IJ SUSP
80.0000 mg | Freq: Once | INTRAMUSCULAR | Status: AC
  Administered 2014-01-22: 80 mg via INTRAMUSCULAR

## 2014-01-22 MED ORDER — KETOROLAC TROMETHAMINE 60 MG/2ML IM SOLN
INTRAMUSCULAR | Status: AC
  Filled 2014-01-22: qty 2

## 2014-01-22 MED ORDER — KETOROLAC TROMETHAMINE 60 MG/2ML IM SOLN
60.0000 mg | Freq: Once | INTRAMUSCULAR | Status: AC
  Administered 2014-01-22: 60 mg via INTRAMUSCULAR

## 2014-01-22 NOTE — Discharge Instructions (Signed)
Back Pain, Adult Low back pain is very common. About 1 in 5 people have back pain.The cause of low back pain is rarely dangerous. The pain often gets better over time.About half of people with a sudden onset of back pain feel better in just 2 weeks. About 8 in 10 people feel better by 6 weeks.  CAUSES Some common causes of back pain include:  Strain of the muscles or ligaments supporting the spine.  Wear and tear (degeneration) of the spinal discs.  Arthritis.  Direct injury to the back. DIAGNOSIS Most of the time, the direct cause of low back pain is not known.However, back pain can be treated effectively even when the exact cause of the pain is unknown.Answering your caregiver's questions about your overall health and symptoms is one of the most accurate ways to make sure the cause of your pain is not dangerous. If your caregiver needs more information, he or she may order lab work or imaging tests (X-rays or MRIs).However, even if imaging tests show changes in your back, this usually does not require surgery. HOME CARE INSTRUCTIONS For many people, back pain returns.Since low back pain is rarely dangerous, it is often a condition that people can learn to manageon their own.   Remain active. It is stressful on the back to sit or stand in one place. Do not sit, drive, or stand in one place for more than 30 minutes at a time. Take short walks on level surfaces as soon as pain allows.Try to increase the length of time you walk each day.  Do not stay in bed.Resting more than 1 or 2 days can delay your recovery.  Do not avoid exercise or work.Your body is made to move.It is not dangerous to be active, even though your back may hurt.Your back will likely heal faster if you return to being active before your pain is gone.  Pay attention to your body when you bend and lift. Many people have less discomfortwhen lifting if they bend their knees, keep the load close to their bodies,and  avoid twisting. Often, the most comfortable positions are those that put less stress on your recovering back.  Find a comfortable position to sleep. Use a firm mattress and lie on your side with your knees slightly bent. If you lie on your back, put a pillow under your knees.  Only take over-the-counter or prescription medicines as directed by your caregiver. Over-the-counter medicines to reduce pain and inflammation are often the most helpful.Your caregiver may prescribe muscle relaxant drugs.These medicines help dull your pain so you can more quickly return to your normal activities and healthy exercise.  Put ice on the injured area.  Put ice in a plastic bag.  Place a towel between your skin and the bag.  Leave the ice on for 15-20 minutes, 03-04 times a day for the first 2 to 3 days. After that, ice and heat may be alternated to reduce pain and spasms.  Ask your caregiver about trying back exercises and gentle massage. This may be of some benefit.  Avoid feeling anxious or stressed.Stress increases muscle tension and can worsen back pain.It is important to recognize when you are anxious or stressed and learn ways to manage it.Exercise is a great option. SEEK MEDICAL CARE IF:  You have pain that is not relieved with rest or medicine.  You have pain that does not improve in 1 week.  You have new symptoms.  You are generally not feeling well. SEEK   IMMEDIATE MEDICAL CARE IF:   You have pain that radiates from your back into your legs.  You develop new bowel or bladder control problems.  You have unusual weakness or numbness in your arms or legs.  You develop nausea or vomiting.  You develop abdominal pain.  You feel faint. Document Released: 11/19/2005 Document Revised: 05/20/2012 Document Reviewed: 04/09/2011 [employer] Patient Information 2014 [employer]. Back Exercises Back exercises help treat and prevent back injuries. The goal of back exercises is to increase  the strength of your abdominal and back muscles and the flexibility of your back. These exercises should be started when you no longer have back pain. Back exercises include:  Pelvic Tilt. Lie on your back with your knees bent. Tilt your pelvis until the lower part of your back is against the floor. Hold this position 5 to 10 sec and repeat 5 to 10 times.  Knee to Chest. Pull first 1 knee up against your chest and hold for 20 to 30 seconds, repeat this with the other knee, and then both knees. This may be done with the other leg straight or bent, whichever feels better.  Sit-Ups or Curl-Ups. Bend your knees 90 degrees. Start with tilting your pelvis, and do a partial, slow sit-up, lifting your trunk only 30 to 45 degrees off the floor. Take at least 2 to 3 seconds for each sit-up. Do not do sit-ups with your knees out straight. If partial sit-ups are difficult, simply do the above but with only tightening your abdominal muscles and holding it as directed.  Hip-Lift. Lie on your back with your knees flexed 90 degrees. Push down with your feet and shoulders as you raise your hips a couple inches off the floor; hold for 10 seconds, repeat 5 to 10 times.  Back arches. Lie on your stomach, propping yourself up on bent elbows. Slowly press on your hands, causing an arch in your low back. Repeat 3 to 5 times. Any initial stiffness and discomfort should lessen with repetition over time.  Shoulder-Lifts. Lie face down with arms beside your body. Keep hips and torso pressed to floor as you slowly lift your head and shoulders off the floor. Do not overdo your exercises, especially in the beginning. Exercises may cause you some mild back discomfort which lasts for a few minutes; however, if the pain is more severe, or lasts for more than 15 minutes, do not continue exercises until you see your caregiver. Improvement with exercise therapy for back problems is slow.  See your caregivers for assistance with  developing a proper back exercise program. Document Released: 12/27/2004 Document Revised: 02/11/2012 Document Reviewed: 09/20/2011 [employer] Patient Information 2014 [employer].  

## 2014-01-22 NOTE — ED Provider Notes (Signed)
Medical screening examination/treatment/procedure(s) were performed by resident physician or non-physician practitioner and as supervising physician I was immediately available for consultation/collaboration.   Barkley BrunsKINDL,Kaizen Ibsen DOUGLAS MD.   Linna HoffJames D Margaret Cockerill, MD 01/22/14 [phone]

## 2014-01-22 NOTE — ED Provider Notes (Signed)
CSN: [id number]     Arrival date & time 01/22/14  1404 History   First MD Initiated Contact with Patient 01/22/14 1447     Chief Complaint  Patient presents with  . Back Pain     (Consider location/radiation/quality/duration/timing/severity/associated sxs/prior Treatment) HPI Comments: 28 year old male with history of a drug overdose and polysubstance abuse presents complaining of 6-7 months of back pain. He reports that 6-7 months ago, he jumped off of a bunk bed and when he stood up, he had immediate severe stabbing pain in his lower back. He says this pain has persisted. The pain is exacerbated by certain movements which cause a sharp stab of pain into his back. He denies any numbness or weakness in his legs, denies any genital numbness. Denies loss of bowel or bladder control. The pain has not worsened recently, he says he has just grown worried because it still hurts this bad which he believes is abnormal.  Patient is a 28 y.o. male presenting with back pain.  Back Pain Associated symptoms: no abdominal pain, no chest pain, no dysuria, no fever and no weakness     Past Medical History  Diagnosis Date  . Erectile dysfunction   . Tobacco use disorder   . Bipolar disorder, unspecified   . Syncope and collapse   . Depression     suicide attempt in 2004, psychiatric admis  . Chronic back pain   . Polysubstance abuse    Past Surgical History  Procedure Laterality Date  . Wisdom tooth extraction     Family History  Problem Relation Age of Onset  . Anxiety disorder Mother   . Bipolar disorder Father   . Schizophrenia Father    History  Substance Use Topics  . Smoking status: Current Every Day Smoker -- 1.50 packs/day  . Smokeless tobacco: Not on file  . Alcohol Use: Yes    Review of Systems  Constitutional: Negative for fever, chills and fatigue.  HENT: Negative for sore throat.   Eyes: Negative for visual disturbance.  Respiratory: Negative for cough and shortness of  breath.   Cardiovascular: Negative for chest pain, palpitations and leg swelling.  Gastrointestinal: Negative for nausea, vomiting, abdominal pain, diarrhea and constipation.  Genitourinary: Negative for dysuria, urgency, frequency and hematuria.  Musculoskeletal: Positive for back pain. Negative for arthralgias, myalgias, neck pain and neck stiffness.  Skin: Negative for rash.  Neurological: Negative for dizziness, weakness and light-headedness.      Allergies  Cefaclor  Home Medications   Current Outpatient Rx  Name  Route  Sig  Dispense  Refill  . lisdexamfetamine (VYVANSE) 40 MG capsule   Oral   Take 40 mg by mouth every morning.         Marland Kitchen. EXPIRED: clonazePAM (KLONOPIN) 1 MG tablet   Oral   Take 1 tablet (1 mg total) by mouth 2 (two) times daily as needed for anxiety.   30 tablet   0   . cyclobenzaprine (FLEXERIL) 10 MG tablet      Take one at bedtime for muscle relaxant.   20 tablet   1   . diclofenac (VOLTAREN) 75 MG EC tablet   Oral   Take 1 tablet (75 mg total) by mouth 2 (two) times daily.   60 tablet   1   . EXPIRED: pantoprazole (PROTONIX) 20 MG tablet   Oral   Take 1 tablet (20 mg total) by mouth daily.   30 tablet   0    BP 169/106  Pulse 106  Temp(Src) 98.5 F (36.9 C) (Oral)  Resp 18  SpO2 100% Physical Exam  Nursing note and vitals reviewed. Constitutional: He is oriented to person, place, and time. He appears well-developed and well-nourished. No distress.  HENT:  Head: Normocephalic.  Pulmonary/Chest: Effort normal. No respiratory distress.  Musculoskeletal:       Lumbar back: He exhibits tenderness (midline, diffuse TTP lumbar spine) and bony tenderness. He exhibits normal range of motion (pain with flexion and lateral rotation but no decreased ROM), no swelling, no deformity and no spasm.  Neurological: He is alert and oriented to person, place, and time. He has normal strength and normal reflexes. He displays no atrophy. No sensory  deficit. He exhibits normal muscle tone. Coordination and gait normal.  Skin: Skin is warm and dry. No rash noted. He is not diaphoretic.  Psychiatric: He has a normal mood and affect. Judgment normal.    ED Course  Procedures (including critical care time) Labs Review Labs Reviewed - No data to display Imaging Review Dg Lumbar Spine Complete  01/22/2014   CLINICAL DATA:  Low back pain after injury.  EXAM: LUMBAR SPINE - COMPLETE 4+ VIEW  COMPARISON:  None.  FINDINGS: There is no evidence of lumbar spine fracture. Alignment is normal. Posterior facet joints appear normal. Intervertebral disc spaces are maintained.  IMPRESSION: Normal lumbar spine.   Electronically Signed   By: Roque Lias M.D.   On: 01/22/2014 15:30      MDM   Final diagnoses:  Back pain    X-rays negative. Given Toradol and Depo-Medrol here. Discharged on diclofenac twice a day when necessary. Referred to orthopedics for followup   Meds ordered this encounter  Medications  . methylPREDNISolone acetate (DEPO-MEDROL) injection 80 mg    Sig:   . ketorolac (TORADOL) injection 60 mg    Sig:   . diclofenac (VOLTAREN) 75 MG EC tablet    Sig: Take 1 tablet (75 mg total) by mouth 2 (two) times daily.    Dispense:  60 tablet    Refill:  1    Order Specific Question:  Supervising Provider    Answer:  Bradd Canary D [[id number]]       Graylon Good, PA-C 01/22/14 [phone]

## 2014-01-22 NOTE — ED Notes (Signed)
C/o lower back pain for the past six to seven months.  Denies injury.  States pain started when he climbed down from bunk bed with standing felt sharp pain.   No relief with ibuprofen.

## 2014-04-23 ENCOUNTER — Encounter: Payer: Self-pay | Admitting: Physician Assistant

## 2014-04-23 ENCOUNTER — Ambulatory Visit (INDEPENDENT_AMBULATORY_CARE_PROVIDER_SITE_OTHER): Payer: BC Managed Care – PPO | Admitting: Physician Assistant

## 2014-04-23 ENCOUNTER — Telehealth: Payer: Self-pay | Admitting: Physician Assistant

## 2014-04-23 VITALS — BP 130/88 | HR 112 | Temp 98.0°F | Resp 16 | Ht 71.5 in | Wt 164.5 lb

## 2014-04-23 DIAGNOSIS — F988 Other specified behavioral and emotional disorders with onset usually occurring in childhood and adolescence: Secondary | ICD-10-CM

## 2014-04-23 DIAGNOSIS — G8929 Other chronic pain: Secondary | ICD-10-CM

## 2014-04-23 DIAGNOSIS — M549 Dorsalgia, unspecified: Secondary | ICD-10-CM

## 2014-04-23 DIAGNOSIS — Z0000 Encounter for general adult medical examination without abnormal findings: Secondary | ICD-10-CM

## 2014-04-23 DIAGNOSIS — F1911 Other psychoactive substance abuse, in remission: Secondary | ICD-10-CM

## 2014-04-23 DIAGNOSIS — K219 Gastro-esophageal reflux disease without esophagitis: Secondary | ICD-10-CM

## 2014-04-23 LAB — CBC
HCT: 46 % (ref 39.0–52.0)
HEMOGLOBIN: 16.1 g/dL (ref 13.0–17.0)
MCH: 32.5 pg (ref 26.0–34.0)
MCHC: 35 g/dL (ref 30.0–36.0)
MCV: 92.7 fL (ref 78.0–100.0)
PLATELETS: 124 10*3/uL — AB (ref 150–400)
RBC: 4.96 MIL/uL (ref 4.22–5.81)
RDW: 13.7 % (ref 11.5–15.5)
WBC: 5.2 10*3/uL (ref 4.0–10.5)

## 2014-04-23 MED ORDER — CYCLOBENZAPRINE HCL 10 MG PO TABS: ORAL_TABLET | ORAL | Status: DC

## 2014-04-23 NOTE — Patient Instructions (Signed)
Please obtain labs. I will call you with your results.  If everything looks good we will refill your other medications.  Please use Flexeril as directed but do not take before driving.  Follow-up with the chiropractor.  It was a pleasure participating in your care today.  Smoking Cessation Quitting smoking is important to your health and has many advantages. However, it is not always easy to quit since nicotine is a very addictive drug. Often times, people try 3 times or more before being able to quit. This document explains the best ways for you to prepare to quit smoking. Quitting takes hard work and a lot of effort, but you can do it. ADVANTAGES OF QUITTING SMOKING  You will live longer, feel better, and live better.  Your body will feel the impact of quitting smoking almost immediately.  Within 20 minutes, blood pressure decreases. Your pulse returns to its normal level.  After 8 hours, carbon monoxide levels in the blood return to normal. Your oxygen level increases.  After 24 hours, the chance of having a heart attack starts to decrease. Your breath, hair, and body stop smelling like smoke.  After 48 hours, damaged nerve endings begin to recover. Your sense of taste and smell improve.  After 72 hours, the body is virtually free of nicotine. Your bronchial tubes relax and breathing becomes easier.  After 2 to 12 weeks, lungs can hold more air. Exercise becomes easier and circulation improves.  The risk of having a heart attack, stroke, cancer, or lung disease is greatly reduced.  After 1 year, the risk of coronary heart disease is cut in half.  After 5 years, the risk of stroke falls to the same as a nonsmoker.  After 10 years, the risk of lung cancer is cut in half and the risk of other cancers decreases significantly.  After 15 years, the risk of coronary heart disease drops, usually to the level of a nonsmoker.  If you are pregnant, quitting smoking will improve your chances  of having a healthy baby.  The people you live with, especially any children, will be healthier.  You will have extra money to spend on things other than cigarettes. QUESTIONS TO THINK ABOUT BEFORE ATTEMPTING TO QUIT You may want to talk about your answers with your caregiver.  Why do you want to quit?  If you tried to quit in the past, what helped and what did not?  What will be the most difficult situations for you after you quit? How will you plan to handle them?  Who can help you through the tough times? Your family? Friends? A caregiver?  What pleasures do you get from smoking? What ways can you still get pleasure if you quit? Here are some questions to ask your caregiver:  How can you help me to be successful at quitting?  What medicine do you think would be best for me and how should I take it?  What should I do if I need more help?  What is smoking withdrawal like? How can I get information on withdrawal? GET READY  Set a quit date.  Change your environment by getting rid of all cigarettes, ashtrays, matches, and lighters in your home, car, or work. Do not let people smoke in your home.  Review your past attempts to quit. Think about what worked and what did not. GET SUPPORT AND ENCOURAGEMENT You have a better chance of being successful if you have help. You can get support in many  ways.  Tell your family, friends, and co-workers that you are going to quit and need their support. Ask them not to smoke around you.  Get individual, group, or telephone counseling and support. Programs are available at [facility] and [facility]. Call your local health department for information about programs in your area.  Spiritual beliefs and practices may help some smokers quit.  Download a "quit meter" on your computer to keep track of quit statistics, such as how long you have gone without smoking, cigarettes not smoked, and money saved.  Get a self-help book about  quitting smoking and staying off of tobacco. LEARN NEW SKILLS AND BEHAVIORS  Distract yourself from urges to smoke. Talk to someone, go for a walk, or occupy your time with a task.  Change your normal routine. Take a different route to work. Drink tea instead of coffee. Eat breakfast in a different place.  Reduce your stress. Take a hot bath, exercise, or read a book.  Plan something enjoyable to do every day. Reward yourself for not smoking.  Explore interactive web-based programs that specialize in helping you quit. GET MEDICINE AND USE IT CORRECTLY Medicines can help you stop smoking and decrease the urge to smoke. Combining medicine with the above behavioral methods and support can greatly increase your chances of successfully quitting smoking.  Nicotine replacement therapy helps deliver nicotine to your body without the negative effects and risks of smoking. Nicotine replacement therapy includes nicotine gum, lozenges, inhalers, nasal sprays, and skin patches. Some may be available over-the-counter and others require a prescription.  Antidepressant medicine helps people abstain from smoking, but how this works is unknown. This medicine is available by prescription.  Nicotinic receptor partial agonist medicine simulates the effect of nicotine in your brain. This medicine is available by prescription. Ask your caregiver for advice about which medicines to use and how to use them based on your health history. Your caregiver will tell you what side effects to look out for if you choose to be on a medicine or therapy. Carefully read the information on the package. Do not use any other product containing nicotine while using a nicotine replacement product.  RELAPSE OR DIFFICULT SITUATIONS Most relapses occur within the first 3 months after quitting. Do not be discouraged if you start smoking again. Remember, most people try several times before finally quitting. You may have symptoms of  withdrawal because your body is used to nicotine. You may crave cigarettes, be irritable, feel very hungry, cough often, get headaches, or have difficulty concentrating. The withdrawal symptoms are only temporary. They are strongest when you first quit, but they will go away within 10 14 days. To reduce the chances of relapse, try to:  Avoid drinking alcohol. Drinking lowers your chances of successfully quitting.  Reduce the amount of caffeine you consume. Once you quit smoking, the amount of caffeine in your body increases and can give you symptoms, such as a rapid heartbeat, sweating, and anxiety.  Avoid smokers because they can make you want to smoke.  Do not let weight gain distract you. Many smokers will gain weight when they quit, usually less than 10 pounds. Eat a healthy diet and stay active. You can always lose the weight gained after you quit.  Find ways to improve your mood other than smoking. FOR MORE INFORMATION  www.smokefree.gov  Document Released: 11/13/2001 Document Revised: 05/20/2012 Document Reviewed: 02/28/2012 [facility] Patient Information 2014 [place] [employer], [place].

## 2014-04-23 NOTE — Progress Notes (Signed)
Pre visit review using our clinic review tool, if applicable. No additional management support is needed unless otherwise documented below in the visit note/SLS  

## 2014-04-23 NOTE — Telephone Encounter (Signed)
Relevant patient education mailed to patient.  

## 2014-04-23 NOTE — Progress Notes (Deleted)
Patient presents to clinic today c/o ***.   Past Medical History  Diagnosis Date  . Erectile dysfunction   . Tobacco use disorder   . Bipolar disorder, unspecified   . Syncope and collapse   . Depression     suicide attempt in 2004, psychiatric admis  . Chronic back pain   . Polysubstance abuse     Current Outpatient Prescriptions on File Prior to Visit  Medication Sig Dispense Refill  . clonazePAM (KLONOPIN) 1 MG tablet Take 1 tablet (1 mg total) by mouth 2 (two) times daily as needed for anxiety.  30 tablet  0  . cyclobenzaprine (FLEXERIL) 10 MG tablet Take one at bedtime for muscle relaxant.  20 tablet  1  . lisdexamfetamine (VYVANSE) 40 MG capsule Take 40 mg by mouth every morning.       No current facility-administered medications on file prior to visit.    Allergies  Allergen Reactions  . Cefaclor Other (See Comments)    childhood    Family History  Problem Relation Age of Onset  . Anxiety disorder Mother   . Bipolar disorder Father   . Schizophrenia Father     History   Social History  . Marital Status: Single    Spouse Name: N/A    Number of Children: N/A  . Years of Education: N/A   Social History Main Topics  . Smoking status: Current Every Day Smoker -- 1.50 packs/day  . Smokeless tobacco: None  . Alcohol Use: Yes  . Drug Use: Yes    Special: Marijuana, Cocaine, MDMA (Ecstacy)  . Sexual Activity: Yes   Other Topics Concern  . None   Social History Narrative  . None   Review of Systems - See HPI.  All other ROS are negative.  Ht 5' 11.5" (1.816 m)  Wt 164 lb 8 oz (74.617 kg)  BMI 22.63 kg/m2  Physical Exam  No results found for this or any previous visit (from the past 2160 hour(s)).  Assessment/Plan: No problem-specific assessment & plan notes found for this encounter.

## 2014-04-24 LAB — DRUG SCREEN, URINE
Amphetamine Screen, Ur: POSITIVE — AB
Barbiturate Quant, Ur: NEGATIVE
Benzodiazepines.: NEGATIVE
CREATININE, U: 70.45 mg/dL
Cocaine Metabolites: NEGATIVE
Marijuana Metabolite: NEGATIVE
Methadone: NEGATIVE
OPIATES: NEGATIVE
PHENCYCLIDINE (PCP): NEGATIVE
PROPOXYPHENE: NEGATIVE

## 2014-04-24 LAB — URINALYSIS, ROUTINE W REFLEX MICROSCOPIC
Bilirubin Urine: NEGATIVE
Glucose, UA: NEGATIVE mg/dL
HGB URINE DIPSTICK: NEGATIVE
KETONES UR: NEGATIVE mg/dL
NITRITE: NEGATIVE
PH: 5.5 (ref 5.0–8.0)
PROTEIN: NEGATIVE mg/dL
Specific Gravity, Urine: 1.012 (ref 1.005–1.030)
UROBILINOGEN UA: 0.2 mg/dL (ref 0.0–1.0)

## 2014-04-24 LAB — BASIC METABOLIC PANEL
BUN: 9 mg/dL (ref 6–23)
CHLORIDE: 101 meq/L (ref 96–112)
CO2: 27 meq/L (ref 19–32)
Calcium: 9.8 mg/dL (ref 8.4–10.5)
Creat: 0.88 mg/dL (ref 0.50–1.35)
GLUCOSE: 76 mg/dL (ref 70–99)
Potassium: 4 mEq/L (ref 3.5–5.3)
SODIUM: 137 meq/L (ref 135–145)

## 2014-04-24 LAB — HEPATIC FUNCTION PANEL
ALBUMIN: 4.8 g/dL (ref 3.5–5.2)
ALT: 27 U/L (ref 0–53)
AST: 22 U/L (ref 0–37)
Alkaline Phosphatase: 65 U/L (ref 39–117)
BILIRUBIN INDIRECT: 0.3 mg/dL (ref 0.2–1.2)
Bilirubin, Direct: 0.1 mg/dL (ref 0.0–0.3)
TOTAL PROTEIN: 7.5 g/dL (ref 6.0–8.3)
Total Bilirubin: 0.4 mg/dL (ref 0.2–1.2)

## 2014-04-24 LAB — URINALYSIS, MICROSCOPIC ONLY
Bacteria, UA: NONE SEEN
CASTS: NONE SEEN
Crystals: NONE SEEN
SQUAMOUS EPITHELIAL / LPF: NONE SEEN

## 2014-04-24 LAB — LIPID PANEL
CHOL/HDL RATIO: 3.1 ratio
Cholesterol: 209 mg/dL — ABNORMAL HIGH (ref 0–200)
HDL: 68 mg/dL (ref >39)
LDL CALC: 126 mg/dL — AB (ref 0–99)
TRIGLYCERIDES: 73 mg/dL (ref <150)
VLDL: 15 mg/dL (ref 0–40)

## 2014-04-24 LAB — TSH: TSH: 0.886 u[IU]/mL (ref 0.350–4.500)

## 2014-04-26 ENCOUNTER — Telehealth: Payer: Self-pay | Admitting: Physician Assistant

## 2014-04-26 NOTE — Telephone Encounter (Signed)
Labs look good overall.  Urine drug screen is negative except for amphetamines, for which he has a prescription (Vyvanse). I will refill his klonopin -- 30 tablets to use sparingly for social anxiety. Will also refill his Vyvanse.  Before refills can be picked up, he needs to sign a controlled-substance contract.  As part of this, he must be willing to accept that he will undergo random urine drug screens. I will be back in the office Wednesday morning at 7 AM and his prescriptions will be waiting at front desk by 7:15 AM.

## 2014-04-27 NOTE — Telephone Encounter (Signed)
Pt informed and states he will come by in the morning for this. Pt was also advised to bring a photo id to sign out RX

## 2014-04-28 ENCOUNTER — Encounter: Payer: Self-pay | Admitting: Physician Assistant

## 2014-04-28 ENCOUNTER — Other Ambulatory Visit: Payer: Self-pay | Admitting: Physician Assistant

## 2014-04-28 DIAGNOSIS — F988 Other specified behavioral and emotional disorders with onset usually occurring in childhood and adolescence: Secondary | ICD-10-CM | POA: Insufficient documentation

## 2014-04-28 DIAGNOSIS — F419 Anxiety disorder, unspecified: Secondary | ICD-10-CM

## 2014-04-28 DIAGNOSIS — F1911 Other psychoactive substance abuse, in remission: Secondary | ICD-10-CM | POA: Insufficient documentation

## 2014-04-28 DIAGNOSIS — Z0000 Encounter for general adult medical examination without abnormal findings: Secondary | ICD-10-CM | POA: Insufficient documentation

## 2014-04-28 MED ORDER — LISDEXAMFETAMINE DIMESYLATE 40 MG PO CAPS: 40.0000 mg | ORAL_CAPSULE | ORAL | Status: DC

## 2014-04-28 MED ORDER — CLONAZEPAM 1 MG PO TABS: 1.0000 mg | ORAL_TABLET | Freq: Two times a day (BID) | ORAL | Status: DC | PRN

## 2014-04-28 NOTE — Telephone Encounter (Signed)
I have corrected it as of this morning -- I did not take out alcohol intoxication as he was found to have high blood alcohol level.  His drug screen was completely negative at the time of his diagnosis so I removed drug overdose from his chart, as there was no evidence to confirm that diagnosis.

## 2014-04-28 NOTE — Assessment & Plan Note (Signed)
Has stopped all recreational drug use within the past 1-2 years.  Patient is on controlled-substance.  Will obtain urine drug screen.

## 2014-04-28 NOTE — Assessment & Plan Note (Signed)
Upon chart review drug screen was negative. Patient diagnosed with alcohol intoxication.  I am removing this diagnosis as per charting it is inaccurate.

## 2014-04-28 NOTE — Progress Notes (Signed)
Patient presents to clinic today to establish care.  Acute Concerns: Requesting medication refills.   Chronic Issues: ADD -- endorses well controlled on Vyvanse 40 mg daily.  Denies palpitations, chest pain, insomnia or anorexia.   Situational anxiety -- endorses being prescribed Klonopin with occasional use. Has noted history of Bipolar disorder but states he has not been treated for this in years.  Was diagnosed in 2012 while he was suffering from depression stemming from loss of a loved one.  Patient also with noted history of alcohol intoxication and drug overdose, seen in one of our ERs in 2013.  Review of charts reveal that patient's drug screen was completely negative. + for alcohol.   Health Maintenance: Dental -- endorses up-to-date Vision -- endorses up-to-date Immunizations -- Tetanus in 05/2013.   Past Medical History  Diagnosis Date  . Erectile dysfunction   . Tobacco use disorder   . Bipolar disorder, unspecified   . Syncope and collapse   . Depression     suicide attempt in 2004, psychiatric admis  . Chronic back pain   . Polysubstance abuse   . Chicken pox   . Acute bronchitis     Past Surgical History  Procedure Laterality Date  . Wisdom tooth extraction      No current outpatient prescriptions on file prior to visit.   No current facility-administered medications on file prior to visit.    Allergies  Allergen Reactions  . Cefaclor Other (See Comments)    childhood    Family History  Problem Relation Age of Onset  . Anxiety disorder Mother     Living  . Bipolar disorder Father 30    Deceased  . Schizophrenia Father   . ADD / ADHD Brother     x2    History   Social History  . Marital Status: Single    Spouse Name: N/A    Number of Children: N/A  . Years of Education: N/A   Occupational History  . Not on file.   Social History Main Topics  . Smoking status: Current Every Day Smoker -- 0.50 packs/day for 15 years  . Smokeless tobacco:  Never Used  . Alcohol Use: 3.0 oz/week    5 Cans of beer per week  . Drug Use: No     Comment: has + history of drug abuse  . Sexual Activity: Yes    Birth Control/ Protection: None     Comment: women - monogamous   Other Topics Concern  . Not on file   Social History Narrative  . No narrative on file   Review of Systems  Constitutional: Negative for fever and weight loss.  HENT: Negative for ear pain, hearing loss and tinnitus.   Eyes: Negative for blurred vision, double vision, photophobia and pain.  Respiratory: Negative for cough and shortness of breath.   Cardiovascular: Negative for chest pain and palpitations.  Gastrointestinal: Negative for heartburn, nausea, vomiting, abdominal pain, diarrhea, constipation, blood in stool and melena.  Genitourinary: Negative for dysuria, urgency, frequency, hematuria and flank pain.  Neurological: Negative for dizziness, loss of consciousness and headaches.  Psychiatric/Behavioral: Negative for depression, suicidal ideas, hallucinations and substance abuse. The patient is nervous/anxious. The patient does not have insomnia.    BP 130/88  Pulse 112  Temp(Src) 98 F (36.7 C) (Oral)  Resp 16  Ht 5' 11.5" (1.816 m)  Wt 164 lb 8 oz (74.617 kg)  BMI 22.63 kg/m2  SpO2 98%  Physical Exam  Vitals reviewed.  Constitutional: He is oriented to person, place, and time and well-developed, well-nourished, and in no distress.  HENT:  Head: Normocephalic and atraumatic.  Right Ear: External ear normal.  Left Ear: External ear normal.  Nose: Nose normal.  Mouth/Throat: Oropharynx is clear and moist. No oropharyngeal exudate.  TM within normal limits bilaterally.   Eyes: Conjunctivae and EOM are normal. Pupils are equal, round, and reactive to light.  Neck: Neck supple. No thyromegaly present.  Cardiovascular: Normal rate, regular rhythm, normal heart sounds and intact distal pulses.   Pulmonary/Chest: Effort normal and breath sounds normal. No  respiratory distress. He has no wheezes. He has no rales. He exhibits no tenderness.  Abdominal: Soft. Bowel sounds are normal. He exhibits no distension and no mass. There is no tenderness. There is no rebound and no guarding.  Lymphadenopathy:    He has no cervical adenopathy.  Neurological: He is alert and oriented to person, place, and time. No cranial nerve deficit.  Skin: Skin is warm and dry. No rash noted.  Psychiatric: Affect normal.   Recent Results (from the past 2160 hour(s))  CBC     Status: Abnormal   Collection Time    04/23/14 11:17 AM      Result Value Ref Range   WBC 5.2  4.0 - 10.5 K/uL   RBC 4.96  4.22 - 5.81 MIL/uL   Hemoglobin 16.1  13.0 - 17.0 g/dL   HCT 16.1  09.6 - 04.5 %   MCV 92.7  78.0 - 100.0 fL   MCH 32.5  26.0 - 34.0 pg   MCHC 35.0  30.0 - 36.0 g/dL   RDW 40.9  81.1 - 91.4 %   Platelets 124 (*) 150 - 400 K/uL  BASIC METABOLIC PANEL     Status: None   Collection Time    04/23/14 11:17 AM      Result Value Ref Range   Sodium 137  135 - 145 mEq/L   Potassium 4.0  3.5 - 5.3 mEq/L   Chloride 101  96 - 112 mEq/L   CO2 27  19 - 32 mEq/L   Glucose, Bld 76  70 - 99 mg/dL   BUN 9  6 - 23 mg/dL   Creat 7.82  9.56 - 2.13 mg/dL   Calcium 9.8  8.4 - 08.6 mg/dL  TSH     Status: None   Collection Time    04/23/14 11:17 AM      Result Value Ref Range   TSH 0.886  0.350 - 4.500 uIU/mL  LIPID PANEL     Status: Abnormal   Collection Time    04/23/14 11:17 AM      Result Value Ref Range   Cholesterol 209 (*) 0 - 200 mg/dL   Comment: ATP III Classification:           < 200        mg/dL        Desirable          200 - 239     mg/dL        Borderline High          >= 240        mg/dL        High         Triglycerides 73  <150 mg/dL   HDL 68  >57 mg/dL   Total CHOL/HDL Ratio 3.1     VLDL 15  0 - 40 mg/dL   LDL  Cholesterol 126 (*) 0 - 99 mg/dL   Comment:       Total Cholesterol/HDL Ratio:CHD Risk                            Coronary Heart Disease Risk Table                                             Men       Women              1/2 Average Risk              3.4        3.3                  Average Risk              5.0        4.4               2X Average Risk              9.6        7.1               3X Average Risk             23.4       11.0     Use the calculated Patient Ratio above and the CHD Risk table      to determine the patient's CHD Risk.     ATP III Classification (LDL):           < 100        mg/dL         Optimal          100 - 129     mg/dL         Near or Above Optimal          130 - 159     mg/dL         Borderline High          160 - 189     mg/dL         High           > 190        mg/dL         Very High        URINALYSIS, ROUTINE W REFLEX MICROSCOPIC     Status: Abnormal   Collection Time    04/23/14 11:17 AM      Result Value Ref Range   Color, Urine YELLOW  YELLOW   APPearance CLEAR  CLEAR   Specific Gravity, Urine 1.012  1.005 - 1.030   pH 5.5  5.0 - 8.0   Glucose, UA NEG  NEG mg/dL   Bilirubin Urine NEG  NEG   Ketones, ur NEG  NEG mg/dL   Hgb urine dipstick NEG  NEG   Protein, ur NEG  NEG mg/dL   Urobilinogen, UA 0.2  0.0 - 1.0 mg/dL   Nitrite NEG  NEG   Leukocytes, UA TRACE (*) NEG  HEPATIC FUNCTION PANEL     Status: None   Collection Time    04/23/14 11:17 AM      Result Value Ref Range   Total Bilirubin 0.4  0.2 -  1.2 mg/dL   Bilirubin, Direct 0.1  0.0 - 0.3 mg/dL   Indirect Bilirubin 0.3  0.2 - 1.2 mg/dL   Alkaline Phosphatase 65  39 - 117 U/L   AST 22  0 - 37 U/L   ALT 27  0 - 53 U/L   Total Protein 7.5  6.0 - 8.3 g/dL   Albumin 4.8  3.5 - 5.2 g/dL  DRUG SCREEN, URINE     Status: Abnormal   Collection Time    04/23/14 11:17 AM      Result Value Ref Range   Benzodiazepines. NEG  Negative   Phencyclidine (PCP) NEG  Negative   Cocaine Metabolites NEG  Negative   Amphetamine Screen, Ur POS (*) Negative   Comment: Result repeated and verified.   Marijuana Metabolite NEG  Negative   Opiates NEG   Negative   Barbiturate Quant, Ur NEG  Negative   Methadone NEG  Negative   Propoxyphene NEG  Negative   Creatinine,U 70.45     Comment:        Cutoff Values for Urine Drug Screen, Pain Mgmt              Drug Class           Cutoff (ng/mL)              Amphetamines             500              Barbiturates             200              Cocaine Metabolites      150              Benzodiazepines          200              Methadone                300              Opiates                  300              Phencyclidine             25              Propoxyphene             300              Marijuana Metabolites     50            For medical purposes only.  URINALYSIS, MICROSCOPIC ONLY     Status: Abnormal   Collection Time    04/23/14 11:17 AM      Result Value Ref Range   Squamous Epithelial / LPF NONE SEEN  RARE   Crystals NONE SEEN  NONE SEEN   Casts NONE SEEN  NONE SEEN   WBC, UA 3-6 (*) <3 WBC/hpf   RBC / HPF 0-2  <3 RBC/hpf   Bacteria, UA NONE SEEN  RARE   Assessment/Plan: Drug overdose Upon chart review drug screen was negative. Patient diagnosed with alcohol intoxication.  I am removing this diagnosis as per charting it is inaccurate.   ADD (attention deficit disorder) Will refill Vyvanse only after a urine drug screen has been performed and a controlled-substance contract has  been signed, especially giving patient's history.  Patient voices agreement.  Will proceed with labs.   Hx of drug abuse Has stopped all recreational drug use within the past 1-2 years.  Patient is on controlled-substance.  Will obtain urine drug screen.   Visit for preventive health examination Medical history reviewed and updated.  Tetanus in 2014. Will obtain fasting labs.

## 2014-04-28 NOTE — Assessment & Plan Note (Signed)
Will refill Vyvanse only after a urine drug screen has been performed and a controlled-substance contract has been signed, especially giving patient's history.  Patient voices agreement.  Will proceed with labs.

## 2014-04-28 NOTE — Telephone Encounter (Signed)
Left message for patient to return my call.

## 2014-04-28 NOTE — Telephone Encounter (Signed)
Patient picked up and signed drug contract. He states that he had already talked to [facility] about changing his records. He says that [facility] had him listed as having a drug overdose but patient states that he thinks it was because of low blood sugar. He says that his bs was 60. He wants to know if this has been corrected in his chart? Best # [phone]

## 2014-04-28 NOTE — Assessment & Plan Note (Signed)
Medical history reviewed and updated.  Tetanus in 2014. Will obtain fasting labs.

## 2014-04-30 NOTE — Telephone Encounter (Signed)
Left message with woman for patient to return my call. 

## 2014-05-05 ENCOUNTER — Telehealth: Payer: Self-pay | Admitting: Physician Assistant

## 2014-05-05 DIAGNOSIS — F909 Attention-deficit hyperactivity disorder, unspecified type: Secondary | ICD-10-CM

## 2014-05-05 NOTE — Telephone Encounter (Signed)
Received paperwork for PA on Vyvanse, forward to nurse

## 2014-05-05 NOTE — Telephone Encounter (Signed)
Called [employer] PA line at [phone] to request appropriate form for patient's Vyvanse approval via Insurance, spoke with Towanda Malkin, who took information for patient & provider, stating he sent fax and it should be received in 15-30 minutes. Awaiting fax/SLS

## 2014-05-10 MED ORDER — AMPHETAMINE-DEXTROAMPHETAMINE 20 MG PO TABS: 20.0000 mg | ORAL_TABLET | Freq: Two times a day (BID) | ORAL | Status: DC

## 2014-05-10 NOTE — Telephone Encounter (Signed)
Please Advise

## 2014-05-10 NOTE — Telephone Encounter (Signed)
LMOM with contact name and number [for return call, if needed] RE: requested change in medication and further provider instructions/SLS

## 2014-05-10 NOTE — Telephone Encounter (Signed)
Will Rx Adderall 20mg  BID.  One-month prescription given.  Patient to return in 3-4 weeks for follow-up to assess symptoms and recheck BP and pulse.  Rx ready for pick up at [facility] office tomorrow morning.

## 2014-05-10 NOTE — Telephone Encounter (Signed)
Patient left a message on Sharon's answering machine stating that his insurance won't cover vyvanse and he would like to know if [facility]ody would just switch him to adderall? Pt states his insurance will cover adderall and his mom got prescribed vyvanse in the past and had to switch to adderall due to this?

## 2014-06-02 ENCOUNTER — Ambulatory Visit: Payer: BC Managed Care – PPO | Admitting: Physician Assistant

## 2014-06-09 ENCOUNTER — Encounter: Payer: Self-pay | Admitting: Physician Assistant

## 2014-06-09 ENCOUNTER — Ambulatory Visit (INDEPENDENT_AMBULATORY_CARE_PROVIDER_SITE_OTHER): Payer: BC Managed Care – PPO | Admitting: Physician Assistant

## 2014-06-09 VITALS — BP 126/86 | HR 110 | Temp 97.9°F | Resp 16 | Ht 71.5 in | Wt 164.5 lb

## 2014-06-09 DIAGNOSIS — F419 Anxiety disorder, unspecified: Secondary | ICD-10-CM

## 2014-06-09 DIAGNOSIS — F988 Other specified behavioral and emotional disorders with onset usually occurring in childhood and adolescence: Secondary | ICD-10-CM

## 2014-06-09 DIAGNOSIS — F411 Generalized anxiety disorder: Secondary | ICD-10-CM

## 2014-06-09 MED ORDER — CLONAZEPAM 1 MG PO TABS: 1.0000 mg | ORAL_TABLET | Freq: Two times a day (BID) | ORAL | Status: DC | PRN

## 2014-06-09 MED ORDER — AMPHETAMINE-DEXTROAMPHET ER 30 MG PO CP24: 30.0000 mg | ORAL_CAPSULE | ORAL | Status: DC

## 2014-06-09 NOTE — Patient Instructions (Signed)
Please take medication as directed.  Follow-up in 1 month.  Please let me know if you have any trouble with the Adderall XR.

## 2014-06-09 NOTE — Progress Notes (Signed)
Pre visit review using our clinic review tool, if applicable. No additional management support is needed unless otherwise documented below in the visit note/SLS  

## 2014-06-13 NOTE — Progress Notes (Signed)
Patient presents to clinic today for follow-up of ADD.  Patient noting some improvement with Adderall 20 mg BID.  States medication begins wearing off too soon in the evening.  He is now working 10-12 hour shifts.  Patient denies palpitations, insomnia, or poor appetite with medication. Pulse is slightly tachycardic in clinic today.  Patient states this is due to running up to our 3rd floor office.  Past Medical History  Diagnosis Date  . Erectile dysfunction   . Tobacco use disorder   . Bipolar disorder, unspecified   . Syncope and collapse   . Depression     suicide attempt in 2004, psychiatric admis  . Chronic back pain   . Polysubstance abuse   . Chicken pox   . Acute bronchitis     Current Outpatient Prescriptions on File Prior to Visit  Medication Sig Dispense Refill  . cyclobenzaprine (FLEXERIL) 10 MG tablet Take one at bedtime for muscle relaxant.  20 tablet  1  . ibuprofen (ADVIL,MOTRIN) 200 MG tablet Take 200 mg by mouth every 6 (six) hours as needed.      . pantoprazole (PROTONIX) 20 MG tablet Take 20 mg by mouth daily as needed.       No current facility-administered medications on file prior to visit.    Allergies  Allergen Reactions  . Cefaclor Other (See Comments)    childhood    Family History  Problem Relation Age of Onset  . Anxiety disorder Mother     Living  . Bipolar disorder Father [id number]    Deceased  . Schizophrenia Father   . ADD / ADHD Brother     x2    History   Social History  . Marital Status: Single    Spouse Name: N/A    Number of Children: N/A  . Years of Education: N/A   Social History Main Topics  . Smoking status: Current Every Day Smoker -- 0.50 packs/day for 15 years  . Smokeless tobacco: Never Used  . Alcohol Use: 3.0 oz/week    5 Cans of beer per week  . Drug Use: No     Comment: has + history of drug abuse  . Sexual Activity: Yes    Birth Control/ Protection: None     Comment: women - monogamous   Other Topics Concern  .  None   Social History Narrative  . None   Review of Systems - See HPI.  All other ROS are negative.  BP 126/86  Pulse 110  Temp(Src) 97.9 F (36.6 C) (Oral)  Resp 16  Ht 5' 11.5" (1.816 m)  Wt 164 lb 8 oz (74.617 kg)  BMI 22.63 kg/m2  SpO2 98%  Physical Exam  Vitals reviewed. Constitutional: He is oriented to person, place, and time and well-developed, well-nourished, and in no distress.  HENT:  Head: Normocephalic and atraumatic.  Eyes: Conjunctivae are normal.  Cardiovascular: Normal rate, regular rhythm, normal heart sounds and intact distal pulses.   Pulmonary/Chest: Effort normal and breath sounds normal. No respiratory distress. He has no wheezes. He has no rales. He exhibits no tenderness.  Neurological: He is alert and oriented to person, place, and time.  Skin: Skin is warm and dry. No rash noted.  Psychiatric: Affect normal.    Recent Results (from the past 2160 hour(s))  CBC     Status: Abnormal   Collection Time    04/23/14 11:17 AM      Result Value Ref Range   WBC 5.2  4.0 - 10.5 K/uL   RBC 4.96  4.22 - 5.81 MIL/uL   Hemoglobin 16.1  13.0 - 17.0 g/dL   HCT 40.9  81.1 - 91.4 %   MCV 92.7  78.0 - 100.0 fL   MCH 32.5  26.0 - 34.0 pg   MCHC 35.0  30.0 - 36.0 g/dL   RDW 78.2  95.6 - 21.3 %   Platelets 124 (*) 150 - 400 K/uL  BASIC METABOLIC PANEL     Status: None   Collection Time    04/23/14 11:17 AM      Result Value Ref Range   Sodium 137  135 - 145 mEq/L   Potassium 4.0  3.5 - 5.3 mEq/L   Chloride 101  96 - 112 mEq/L   CO2 27  19 - 32 mEq/L   Glucose, Bld 76  70 - 99 mg/dL   BUN 9  6 - 23 mg/dL   Creat 0.86  5.78 - 4.69 mg/dL   Calcium 9.8  8.4 - 62.9 mg/dL  TSH     Status: None   Collection Time    04/23/14 11:17 AM      Result Value Ref Range   TSH 0.886  0.350 - 4.500 uIU/mL  LIPID PANEL     Status: Abnormal   Collection Time    04/23/14 11:17 AM      Result Value Ref Range   Cholesterol 209 (*) 0 - 200 mg/dL   Comment: ATP III  Classification:           < 200        mg/dL        Desirable          200 - 239     mg/dL        Borderline High          >= 240        mg/dL        High         Triglycerides 73  <150 mg/dL   HDL 68  >52 mg/dL   Total CHOL/HDL Ratio 3.1     VLDL 15  0 - 40 mg/dL   LDL Cholesterol 841 (*) 0 - 99 mg/dL   Comment:       Total Cholesterol/HDL Ratio:CHD Risk                            Coronary Heart Disease Risk Table                                            Men       Women              1/2 Average Risk              3.4        3.3                  Average Risk              5.0        4.4               2X Average Risk              9.6        7.1  3X Average Risk             23.4       11.0     Use the calculated Patient Ratio above and the CHD Risk table      to determine the patient's CHD Risk.     ATP III Classification (LDL):           < 100        mg/dL         Optimal          100 - 129     mg/dL         Near or Above Optimal          130 - 159     mg/dL         Borderline High          160 - 189     mg/dL         High           > 190        mg/dL         Very High        URINALYSIS, ROUTINE W REFLEX MICROSCOPIC     Status: Abnormal   Collection Time    04/23/14 11:17 AM      Result Value Ref Range   Color, Urine YELLOW  YELLOW   APPearance CLEAR  CLEAR   Specific Gravity, Urine 1.012  1.005 - 1.030   pH 5.5  5.0 - 8.0   Glucose, UA NEG  NEG mg/dL   Bilirubin Urine NEG  NEG   Ketones, ur NEG  NEG mg/dL   Hgb urine dipstick NEG  NEG   Protein, ur NEG  NEG mg/dL   Urobilinogen, UA 0.2  0.0 - 1.0 mg/dL   Nitrite NEG  NEG   Leukocytes, UA TRACE (*) NEG  HEPATIC FUNCTION PANEL     Status: None   Collection Time    04/23/14 11:17 AM      Result Value Ref Range   Total Bilirubin 0.4  0.2 - 1.2 mg/dL   Bilirubin, Direct 0.1  0.0 - 0.3 mg/dL   Indirect Bilirubin 0.3  0.2 - 1.2 mg/dL   Alkaline Phosphatase 65  39 - 117 U/L   AST 22  0 - 37 U/L   ALT 27  0 -  53 U/L   Total Protein 7.5  6.0 - 8.3 g/dL   Albumin 4.8  3.5 - 5.2 g/dL  DRUG SCREEN, URINE     Status: Abnormal   Collection Time    04/23/14 11:17 AM      Result Value Ref Range   Benzodiazepines. NEG  Negative   Phencyclidine (PCP) NEG  Negative   Cocaine Metabolites NEG  Negative   Amphetamine Screen, Ur POS (*) Negative   Comment: Result repeated and verified.   Marijuana Metabolite NEG  Negative   Opiates NEG  Negative   Barbiturate Quant, Ur NEG  Negative   Methadone NEG  Negative   Propoxyphene NEG  Negative   Creatinine,U 70.45     Comment:        Cutoff Values for Urine Drug Screen, Pain Mgmt              Drug Class           Cutoff (ng/mL)              Amphetamines  500              Barbiturates             200              Cocaine Metabolites      150              Benzodiazepines          200              Methadone                300              Opiates                  300              Phencyclidine             25              Propoxyphene             300              Marijuana Metabolites     50            For medical purposes only.  URINALYSIS, MICROSCOPIC ONLY     Status: Abnormal   Collection Time    04/23/14 11:17 AM      Result Value Ref Range   Squamous Epithelial / LPF NONE SEEN  RARE   Crystals NONE SEEN  NONE SEEN   Casts NONE SEEN  NONE SEEN   WBC, UA 3-6 (*) <3 WBC/hpf   RBC / HPF 0-2  <3 RBC/hpf   Bacteria, UA NONE SEEN  RARE    Assessment/Plan: ADD (attention deficit disorder) Will switch to Adderall XR at 30 mg dosing.  Patient to follow-up in 2-4 weeks for BP and pulse recheck.  ADRs discussed with patient.  Patient aware when to stop medication if indicated.

## 2014-06-13 NOTE — Assessment & Plan Note (Signed)
Will switch to Adderall XR at 30 mg dosing.  Patient to follow-up in 2-4 weeks for BP and pulse recheck.  ADRs discussed with patient.  Patient aware when to stop medication if indicated.

## 2014-07-01 ENCOUNTER — Ambulatory Visit (INDEPENDENT_AMBULATORY_CARE_PROVIDER_SITE_OTHER): Payer: BC Managed Care – PPO | Admitting: Physician Assistant

## 2014-07-01 ENCOUNTER — Encounter: Payer: Self-pay | Admitting: Physician Assistant

## 2014-07-01 VITALS — BP 138/94 | HR 108 | Temp 98.2°F | Resp 16 | Ht 71.5 in | Wt 163.2 lb

## 2014-07-01 DIAGNOSIS — F988 Other specified behavioral and emotional disorders with onset usually occurring in childhood and adolescence: Secondary | ICD-10-CM

## 2014-07-01 DIAGNOSIS — F411 Generalized anxiety disorder: Secondary | ICD-10-CM

## 2014-07-01 DIAGNOSIS — F419 Anxiety disorder, unspecified: Secondary | ICD-10-CM

## 2014-07-01 DIAGNOSIS — M62838 Other muscle spasm: Secondary | ICD-10-CM

## 2014-07-01 MED ORDER — AMPHETAMINE-DEXTROAMPHETAMINE 10 MG PO TABS: ORAL_TABLET | ORAL | Status: DC

## 2014-07-01 MED ORDER — AMPHETAMINE-DEXTROAMPHET ER 30 MG PO CP24: 30.0000 mg | ORAL_CAPSULE | ORAL | Status: DC

## 2014-07-01 MED ORDER — CLONAZEPAM 1 MG PO TABS: 1.0000 mg | ORAL_TABLET | Freq: Two times a day (BID) | ORAL | Status: DC | PRN

## 2014-07-01 MED ORDER — CYCLOBENZAPRINE HCL 10 MG PO TABS: 10.0000 mg | ORAL_TABLET | Freq: Every day | ORAL | Status: DC

## 2014-07-01 NOTE — Progress Notes (Signed)
Pre visit review using our clinic review tool, if applicable. No additional management support is needed unless otherwise documented below in the visit note/SLS  

## 2014-07-02 NOTE — Assessment & Plan Note (Signed)
Continue Adderall XR.  Will add on SA Adderall to use in afternoons.  Follow-up 3 months.  Will repeat random UDS at that time.

## 2014-07-02 NOTE — Progress Notes (Signed)
Patient presents to clinic today for medication management.  Patient doing better on Adderall XR.  Still having some residual symptoms in late afternoon while patient still having to focus at work. Is currently working two jobs.  Tolerates medication well without side effect.  Past Medical History  Diagnosis Date  . Erectile dysfunction   . Tobacco use disorder   . Bipolar disorder, unspecified   . Syncope and collapse   . Depression     suicide attempt in 2004, psychiatric admis  . Chronic back pain   . Polysubstance abuse   . Chicken pox   . Acute bronchitis     Current Outpatient Prescriptions on File Prior to Visit  Medication Sig Dispense Refill  . ibuprofen (ADVIL,MOTRIN) 200 MG tablet Take 200 mg by mouth every 6 (six) hours as needed.      . pantoprazole (PROTONIX) 20 MG tablet Take 20 mg by mouth daily as needed.       No current facility-administered medications on file prior to visit.    Allergies  Allergen Reactions  . Cefaclor Other (See Comments)    childhood    Family History  Problem Relation Age of Onset  . Anxiety disorder Mother     Living  . Bipolar disorder Father [id number]    Deceased  . Schizophrenia Father   . ADD / ADHD Brother     x2    History   Social History  . Marital Status: Single    Spouse Name: N/A    Number of Children: N/A  . Years of Education: N/A   Social History Main Topics  . Smoking status: Current Every Day Smoker -- 0.50 packs/day for 15 years  . Smokeless tobacco: Never Used  . Alcohol Use: 3.0 oz/week    5 Cans of beer per week  . Drug Use: No     Comment: has + history of drug abuse  . Sexual Activity: Yes    Birth Control/ Protection: None     Comment: women - monogamous   Other Topics Concern  . None   Social History Narrative  . None    Review of Systems - See HPI.  All other ROS are negative.  BP 138/94  Pulse 108  Temp(Src) 98.2 F (36.8 C) (Oral)  Resp 16  Ht 5' 11.5" (1.816 m)  Wt 163 lb 4 oz  (74.05 kg)  BMI 22.45 kg/m2  SpO2 99%  Physical Exam  Vitals reviewed. Constitutional: He is oriented to person, place, and time and well-developed, well-nourished, and in no distress.  HENT:  Head: Normocephalic and atraumatic.  Eyes: Conjunctivae are normal.  Cardiovascular: Normal rate, regular rhythm, normal heart sounds and intact distal pulses.   Pulmonary/Chest: Effort normal and breath sounds normal. No respiratory distress. He has no wheezes. He has no rales. He exhibits no tenderness.  Neurological: He is alert and oriented to person, place, and time.  Skin: Skin is warm and dry. No rash noted.  Psychiatric: Affect normal.    Recent Results (from the past 2160 hour(s))  CBC     Status: Abnormal   Collection Time    04/23/14 11:17 AM      Result Value Ref Range   WBC 5.2  4.0 - 10.5 K/uL   RBC 4.96  4.22 - 5.81 MIL/uL   Hemoglobin 16.1  13.0 - 17.0 g/dL   HCT 96.046.0  45.439.0 - 09.852.0 %   MCV 92.7  78.0 - 100.0 fL   MCH  32.5  26.0 - 34.0 pg   MCHC 35.0  30.0 - 36.0 g/dL   RDW 16.1  09.6 - 04.5 %   Platelets 124 (*) 150 - 400 K/uL  BASIC METABOLIC PANEL     Status: None   Collection Time    04/23/14 11:17 AM      Result Value Ref Range   Sodium 137  135 - 145 mEq/L   Potassium 4.0  3.5 - 5.3 mEq/L   Chloride 101  96 - 112 mEq/L   CO2 27  19 - 32 mEq/L   Glucose, Bld 76  70 - 99 mg/dL   BUN 9  6 - 23 mg/dL   Creat 4.09  8.11 - 9.14 mg/dL   Calcium 9.8  8.4 - 78.2 mg/dL  TSH     Status: None   Collection Time    04/23/14 11:17 AM      Result Value Ref Range   TSH 0.886  0.350 - 4.500 uIU/mL  LIPID PANEL     Status: Abnormal   Collection Time    04/23/14 11:17 AM      Result Value Ref Range   Cholesterol 209 (*) 0 - 200 mg/dL   Comment: ATP III Classification:           < 200        mg/dL        Desirable          200 - 239     mg/dL        Borderline High          >= 240        mg/dL        High         Triglycerides 73  <150 mg/dL   HDL 68  >95 mg/dL   Total  CHOL/HDL Ratio 3.1     VLDL 15  0 - 40 mg/dL   LDL Cholesterol 621 (*) 0 - 99 mg/dL   Comment:       Total Cholesterol/HDL Ratio:CHD Risk                            Coronary Heart Disease Risk Table                                            Men       Women              1/2 Average Risk              3.4        3.3                  Average Risk              5.0        4.4               2X Average Risk              9.6        7.1               3X Average Risk             23.4       11.0     Use the  calculated Patient Ratio above and the CHD Risk table      to determine the patient's CHD Risk.     ATP III Classification (LDL):           < 100        mg/dL         Optimal          100 - 129     mg/dL         Near or Above Optimal          130 - 159     mg/dL         Borderline High          160 - 189     mg/dL         High           > 190        mg/dL         Very High        URINALYSIS, ROUTINE W REFLEX MICROSCOPIC     Status: Abnormal   Collection Time    04/23/14 11:17 AM      Result Value Ref Range   Color, Urine YELLOW  YELLOW   APPearance CLEAR  CLEAR   Specific Gravity, Urine 1.012  1.005 - 1.030   pH 5.5  5.0 - 8.0   Glucose, UA NEG  NEG mg/dL   Bilirubin Urine NEG  NEG   Ketones, ur NEG  NEG mg/dL   Hgb urine dipstick NEG  NEG   Protein, ur NEG  NEG mg/dL   Urobilinogen, UA 0.2  0.0 - 1.0 mg/dL   Nitrite NEG  NEG   Leukocytes, UA TRACE (*) NEG  HEPATIC FUNCTION PANEL     Status: None   Collection Time    04/23/14 11:17 AM      Result Value Ref Range   Total Bilirubin 0.4  0.2 - 1.2 mg/dL   Bilirubin, Direct 0.1  0.0 - 0.3 mg/dL   Indirect Bilirubin 0.3  0.2 - 1.2 mg/dL   Alkaline Phosphatase 65  39 - 117 U/L   AST 22  0 - 37 U/L   ALT 27  0 - 53 U/L   Total Protein 7.5  6.0 - 8.3 g/dL   Albumin 4.8  3.5 - 5.2 g/dL  DRUG SCREEN, URINE     Status: Abnormal   Collection Time    04/23/14 11:17 AM      Result Value Ref Range   Benzodiazepines. NEG  Negative    Phencyclidine (PCP) NEG  Negative   Cocaine Metabolites NEG  Negative   Amphetamine Screen, Ur POS (*) Negative   Comment: Result repeated and verified.   Marijuana Metabolite NEG  Negative   Opiates NEG  Negative   Barbiturate Quant, Ur NEG  Negative   Methadone NEG  Negative   Propoxyphene NEG  Negative   Creatinine,U 70.45     Comment:        Cutoff Values for Urine Drug Screen, Pain Mgmt              Drug Class           Cutoff (ng/mL)              Amphetamines             500              Barbiturates  200              Cocaine Metabolites      150              Benzodiazepines          200              Methadone                300              Opiates                  300              Phencyclidine             25              Propoxyphene             300              Marijuana Metabolites     50            For medical purposes only.  URINALYSIS, MICROSCOPIC ONLY     Status: Abnormal   Collection Time    04/23/14 11:17 AM      Result Value Ref Range   Squamous Epithelial / LPF NONE SEEN  RARE   Crystals NONE SEEN  NONE SEEN   Casts NONE SEEN  NONE SEEN   WBC, UA 3-6 (*) <3 WBC/hpf   RBC / HPF 0-2  <3 RBC/hpf   Bacteria, UA NONE SEEN  RARE    Assessment/Plan: ADD (attention deficit disorder) Continue Adderall XR.  Will add on SA Adderall to use in afternoons.  Follow-up 3 months.  Will repeat random UDS at that time.

## 2014-07-02 NOTE — Patient Instructions (Signed)
Continue medications as directed.  I will see you in 3 months.

## 2014-08-03 ENCOUNTER — Ambulatory Visit (INDEPENDENT_AMBULATORY_CARE_PROVIDER_SITE_OTHER): Payer: BC Managed Care – PPO | Admitting: Physician Assistant

## 2014-08-03 ENCOUNTER — Encounter: Payer: Self-pay | Admitting: Physician Assistant

## 2014-08-03 VITALS — BP 120/70 | HR 78 | Temp 98.0°F | Wt 165.0 lb

## 2014-08-03 DIAGNOSIS — Z0283 Encounter for blood-alcohol and blood-drug test: Secondary | ICD-10-CM

## 2014-08-03 DIAGNOSIS — F419 Anxiety disorder, unspecified: Secondary | ICD-10-CM

## 2014-08-03 DIAGNOSIS — F411 Generalized anxiety disorder: Secondary | ICD-10-CM

## 2014-08-03 DIAGNOSIS — Z0189 Encounter for other specified special examinations: Secondary | ICD-10-CM

## 2014-08-03 DIAGNOSIS — F988 Other specified behavioral and emotional disorders with onset usually occurring in childhood and adolescence: Secondary | ICD-10-CM

## 2014-08-03 MED ORDER — AMPHETAMINE-DEXTROAMPHETAMINE 10 MG PO TABS: ORAL_TABLET | ORAL | Status: DC

## 2014-08-03 MED ORDER — CLONAZEPAM 1 MG PO TABS: 1.0000 mg | ORAL_TABLET | Freq: Two times a day (BID) | ORAL | Status: DC | PRN

## 2014-08-03 NOTE — Assessment & Plan Note (Signed)
Doing well.  Vitals within normal limits.  Continue current regimen.  UDS obtained today.

## 2014-08-03 NOTE — Patient Instructions (Signed)
Please continue care as discussed at visit.  Please stop by lab for urine testing.  If everything looks good, follow-up in 6 months.  Return sooner if needed.

## 2014-08-03 NOTE — Progress Notes (Signed)
Patient presents to clinic today for follow-up for ADD after addition of evening dose 10 mg SA Adderall. Patient endorses significant improvement in afternoon/evening symptoms. Denies chest pain, palpitations, insomnia or decreased appetite.  Is doing well overall.  Past Medical History  Diagnosis Date  . Erectile dysfunction   . Tobacco use disorder   . Bipolar disorder, unspecified   . Syncope and collapse   . Depression     suicide attempt in 2004, psychiatric admis  . Chronic back pain   . Polysubstance abuse   . Chicken pox   . Acute bronchitis     Current Outpatient Prescriptions on File Prior to Visit  Medication Sig Dispense Refill  . amphetamine-dextroamphetamine (ADDERALL XR) 30 MG 24 hr capsule Take 1 capsule (30 mg total) by mouth every morning.  30 capsule  0  . cyclobenzaprine (FLEXERIL) 10 MG tablet Take 1 tablet (10 mg total) by mouth at bedtime. Take one at bedtime for muscle relaxant.  30 tablet  1  . ibuprofen (ADVIL,MOTRIN) 200 MG tablet Take 200 mg by mouth every 6 (six) hours as needed.      . pantoprazole (PROTONIX) 20 MG tablet Take 20 mg by mouth daily as needed.       No current facility-administered medications on file prior to visit.    Allergies  Allergen Reactions  . Cefaclor Other (See Comments)    childhood    Family History  Problem Relation Age of Onset  . Anxiety disorder Mother     Living  . Bipolar disorder Father 41    Deceased  . Schizophrenia Father   . ADD / ADHD Brother     x2    History   Social History  . Marital Status: Single    Spouse Name: N/A    Number of Children: N/A  . Years of Education: N/A   Social History Main Topics  . Smoking status: Current Every Day Smoker -- 0.50 packs/day for 15 years  . Smokeless tobacco: Never Used  . Alcohol Use: 3.0 oz/week    5 Cans of beer per week  . Drug Use: No     Comment: has + history of drug abuse  . Sexual Activity: Yes    Birth Control/ Protection: None   Comment: women - monogamous   Other Topics Concern  . None   Social History Narrative  . None   Review of Systems - See HPI.  All other ROS are negative.  BP 120/70  Pulse 78  Temp(Src) 98 F (36.7 C)  Wt 165 lb (74.844 kg)  SpO2 100%  Physical Exam  Vitals reviewed. Constitutional: He is oriented to person, place, and time and well-developed, well-nourished, and in no distress.  HENT:  Head: Normocephalic and atraumatic.  Right Ear: External ear normal.  Left Ear: External ear normal.  Nose: Nose normal.  Mouth/Throat: Oropharynx is clear and moist.  Eyes: Conjunctivae are normal.  Neck: Neck supple. No thyromegaly present.  Cardiovascular: Normal rate, regular rhythm, normal heart sounds and intact distal pulses.   Pulmonary/Chest: Effort normal and breath sounds normal. No respiratory distress. He has no wheezes. He has no rales. He exhibits no tenderness.  Neurological: He is alert and oriented to person, place, and time.  Skin: Skin is warm and dry. No rash noted.  Psychiatric: Affect normal.    No results found for this or any previous visit (from the past 2160 hour(s)).  Assessment/Plan: ADD (attention deficit disorder) Doing well.  Vitals  within normal limits.  Continue current regimen.  UDS obtained today.

## 2014-08-03 NOTE — Progress Notes (Signed)
Pre visit review using our clinic review tool, if applicable. No additional management support is needed unless otherwise documented below in the visit note. 

## 2014-08-18 ENCOUNTER — Encounter: Payer: Self-pay | Admitting: Physician Assistant

## 2015-02-04 IMAGING — CR DG LUMBAR SPINE COMPLETE 4+V
5 series · 5 of 5 positions shown · non-contrast
Comparison: None.

CLINICAL DATA: Low back pain after injury.

EXAM:
LUMBAR SPINE - COMPLETE 4+ VIEW

[view not recorded (1 of 5)]
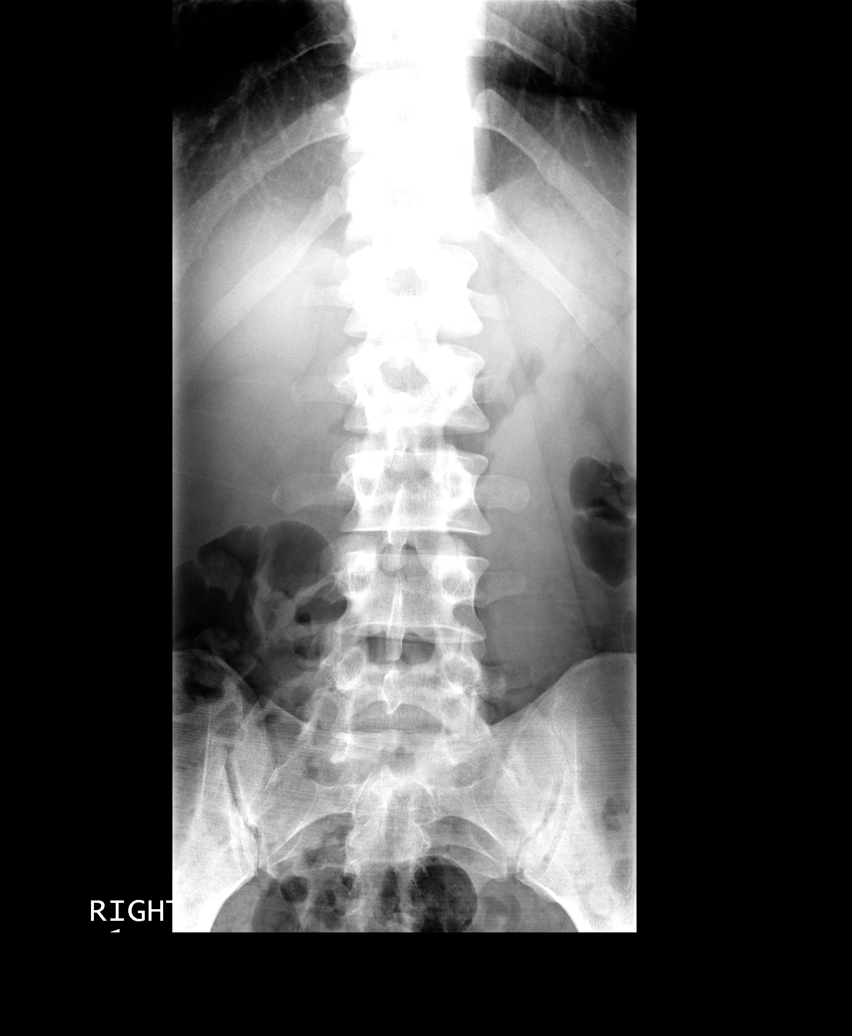

[view not recorded (2 of 5)]
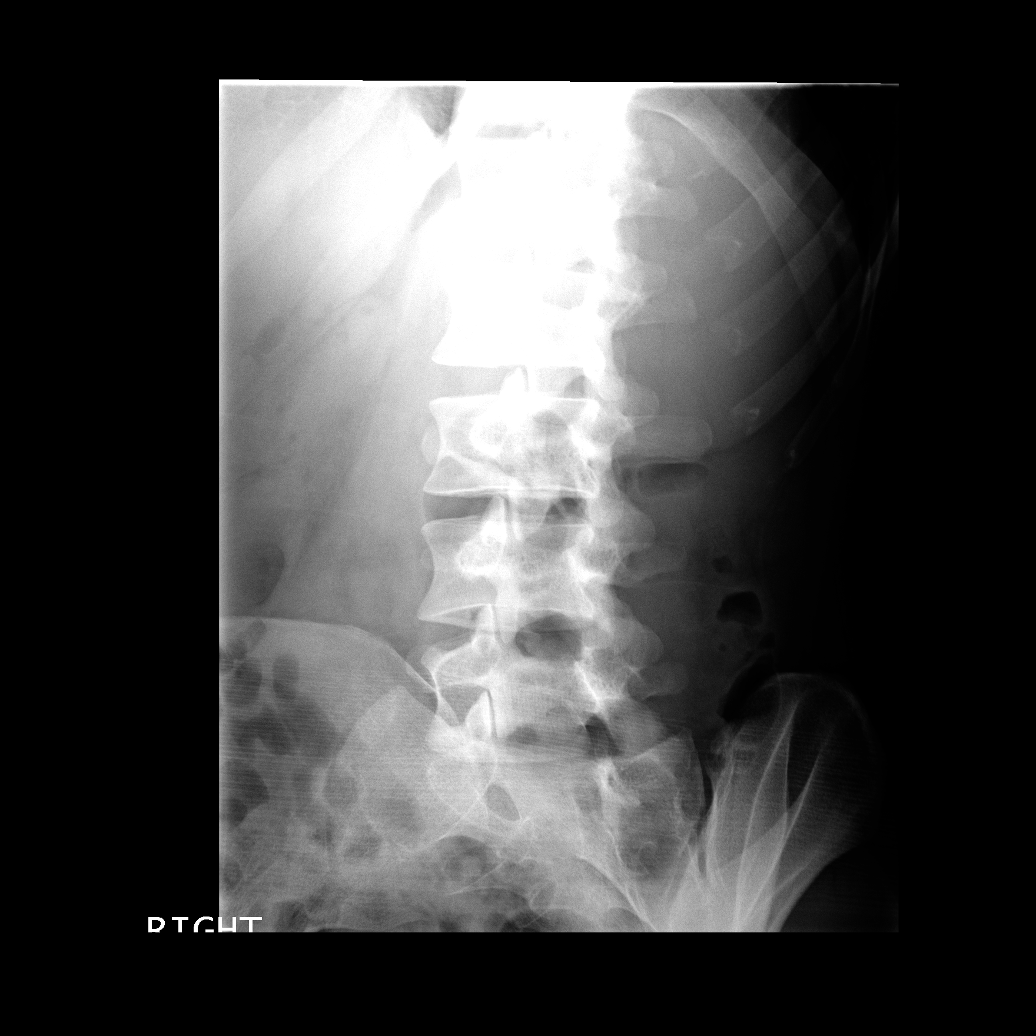

[view not recorded (3 of 5)]
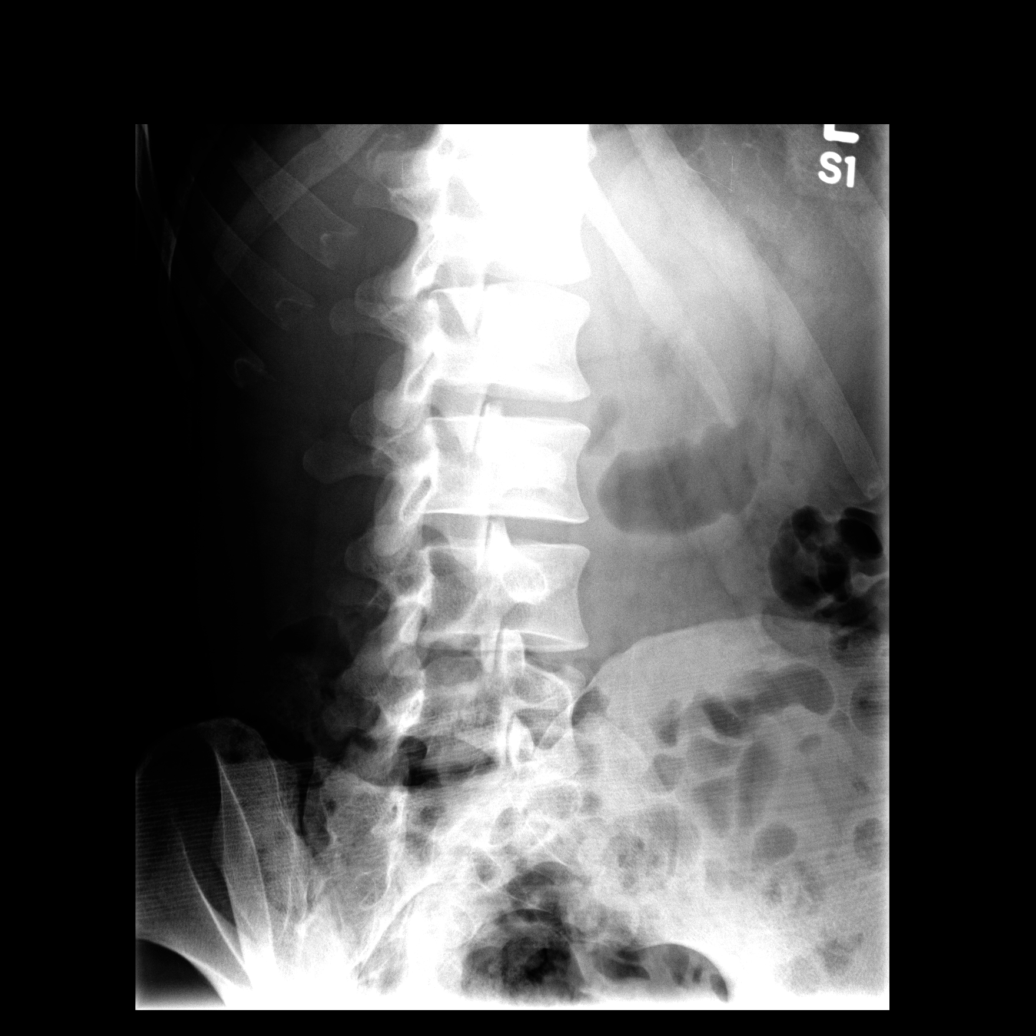

[view not recorded (4 of 5)]
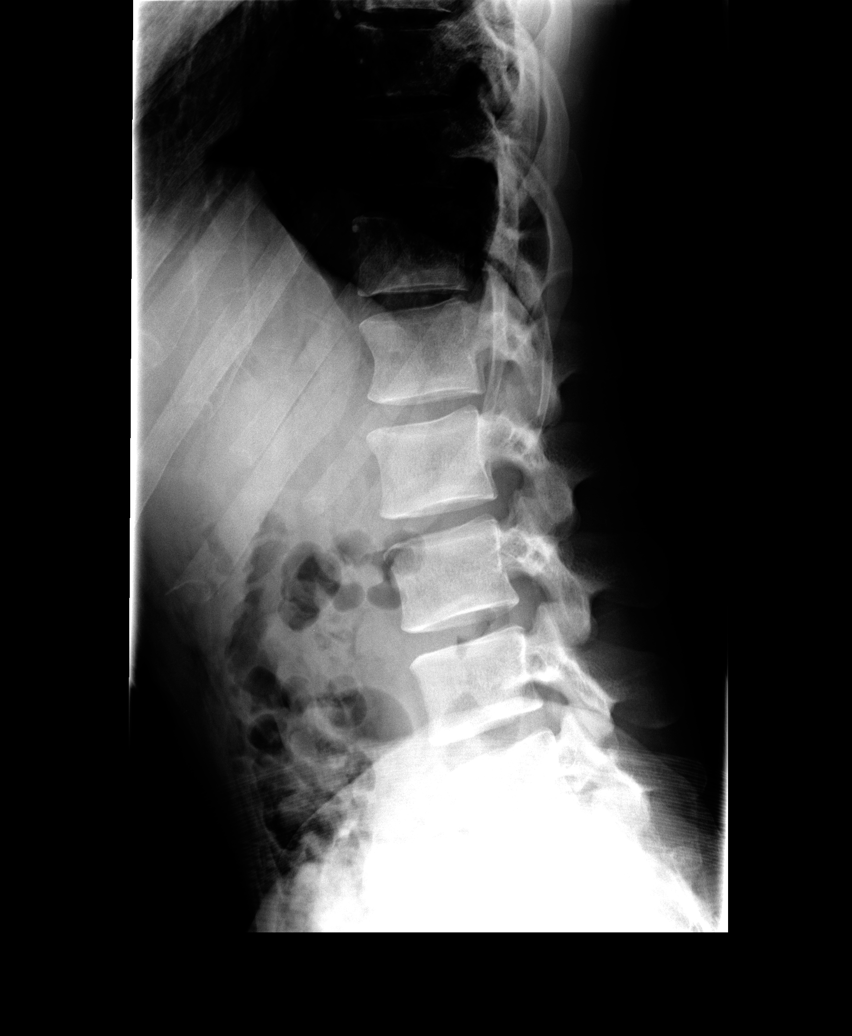

[view not recorded (5 of 5)]
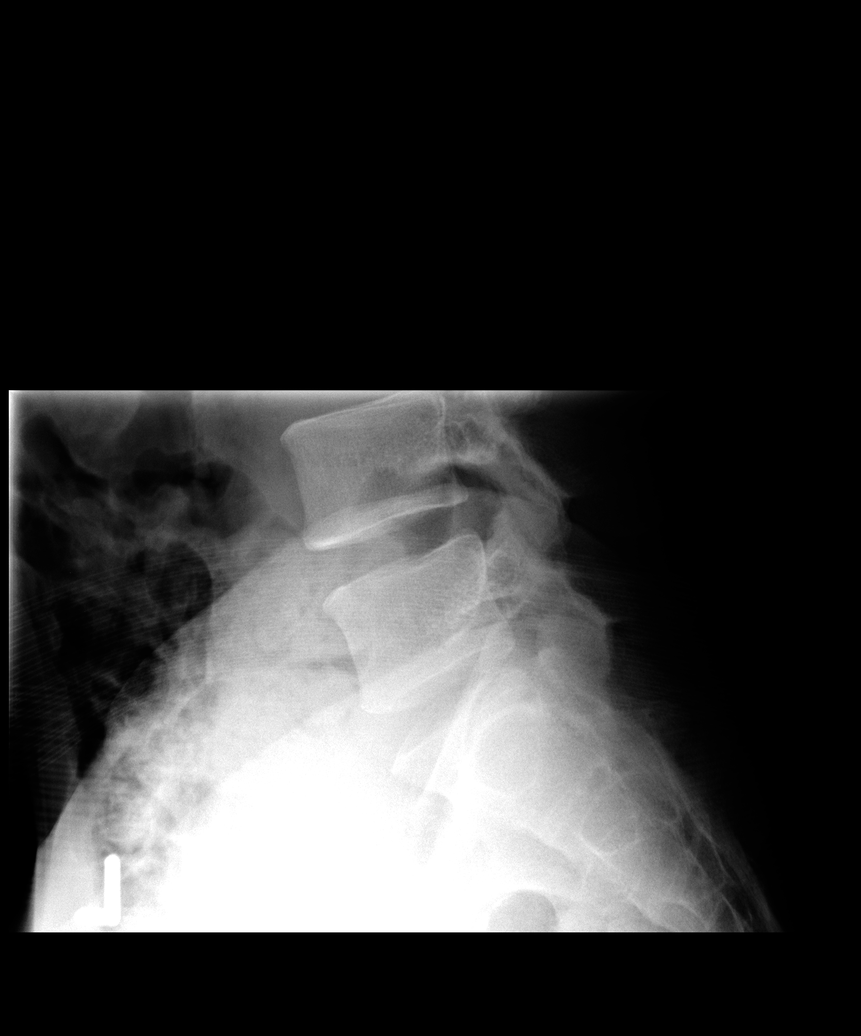

[5 of 5 positions shown; findings below may reference images not displayed]

FINDINGS: There is no evidence of lumbar spine fracture. Alignment is normal.
Posterior facet joints appear normal. Intervertebral disc spaces are
maintained.
IMPRESSION: Normal lumbar spine.

## 2016-08-23 ENCOUNTER — Ambulatory Visit (HOSPITAL_COMMUNITY)
Admission: EM | Admit: 2016-08-23 | Discharge: 2016-08-23 | Disposition: A | Discharge status: Home / Self Care | Payer: Self-pay | Attending: Internal Medicine | Admitting: Internal Medicine

## 2016-08-23 ENCOUNTER — Encounter (HOSPITAL_COMMUNITY): Payer: Self-pay | Admitting: Family Medicine

## 2016-08-23 DIAGNOSIS — H7292 Unspecified perforation of tympanic membrane, left ear: Secondary | ICD-10-CM

## 2016-08-23 DIAGNOSIS — T700XXA Otitic barotrauma, initial encounter: Secondary | ICD-10-CM

## 2016-08-23 MED ORDER — AMOXICILLIN-POT CLAVULANATE 875-125 MG PO TABS: 1.0000 | ORAL_TABLET | Freq: Two times a day (BID) | ORAL | 0 refills | Status: DC
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
## 2016-08-23 NOTE — Discharge Instructions (Signed)
DO NOT PUT ANY OBJECTS INTO THE EAR.   TAKE MEDICATION AS DIRECTED  SEE DR. Ezzard StandingNEWMAN FOR FOLLOW UP AS DIRECTED.  RETURN IF THERE ARE NEW OR WORSENING OF SYMPTOMS
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
## 2016-10-22 ENCOUNTER — Encounter (HOSPITAL_COMMUNITY): Payer: Self-pay | Admitting: Emergency Medicine

## 2016-10-22 ENCOUNTER — Ambulatory Visit (HOSPITAL_COMMUNITY)
Admission: EM | Admit: 2016-10-22 | Discharge: 2016-10-22 | Disposition: A | Discharge status: Home / Self Care | Payer: Self-pay | Attending: Emergency Medicine | Admitting: Emergency Medicine

## 2016-10-22 ENCOUNTER — Ambulatory Visit (INDEPENDENT_AMBULATORY_CARE_PROVIDER_SITE_OTHER): Payer: Self-pay

## 2016-10-22 DIAGNOSIS — S63610A Unspecified sprain of right index finger, initial encounter: Secondary | ICD-10-CM

## 2016-10-22 NOTE — ED Triage Notes (Signed)
Right index finger 3 weeks ago.  Continues to have pain

## 2016-10-22 NOTE — Discharge Instructions (Signed)
There was no evidence of fracture on exam or on x-rays. It appears that your tendons are intact. You  most likely sprained your finger. We will have her fingers buddy taped for the next 24 hours to help with inflammation. Please follow-up with your primary care physician. You can continue to take ibuprofen or Tylenol as needed for pain.

## 2016-10-22 NOTE — ED Provider Notes (Signed)
CSN: [id number]     Arrival date & time 10/22/16  1322 History   First MD Initiated Contact with Patient 10/22/16 1542     Chief Complaint  Patient presents with  . Finger Injury   (Consider location/radiation/quality/duration/timing/severity/associated sxs/prior Treatment) HPI  Approximately 3-3.5weeks ago, the patient was working with a water saw and a portion fell on his R index finger causing a crush injury. He notes that after this, his pain subsided over a few days, however he still notes some pain with movement. He noted bruising and swelling initially. He feels swelling is improved but he feels there's a bulge distal to the injury (just distal to the PIP joint).    He feels that his functionality has not improved. He noted numbness/tingling of the dorsal aspect of his finger over the last 1-1.5 weeks. He feels he can't bend his finger completely and felt that the bulge meant there could be splinter or fracture of the bone.   No previous injuries to the bone. Has tried occasional Ibuprofen with improvement in pain.   Past Medical History:  Diagnosis Date  . Acute bronchitis   . Bipolar disorder, unspecified   . Chicken pox   . Chronic back pain   . Depression    suicide attempt in 2004, psychiatric admis  . Erectile dysfunction   . Polysubstance abuse   . Syncope and collapse   . Tobacco use disorder    Past Surgical History:  Procedure Laterality Date  . WISDOM TOOTH EXTRACTION     Family History  Problem Relation Age of Onset  . Anxiety disorder Mother     Living  . Bipolar disorder Father [id number]    Deceased  . Schizophrenia Father   . ADD / ADHD Brother     x2   Social History  Substance Use Topics  . Smoking status: Current Every Day Smoker    Packs/day: 0.50    Years: 15.00  . Smokeless tobacco: Never Used  . Alcohol use 3.0 oz/week    5 Cans of beer per week    Review of Systems  Constitutional: Negative for activity change, appetite change, chills and  fever.  HENT: Negative.  Negative for congestion.   Eyes: Negative.   Respiratory: Negative.   Cardiovascular: Negative.   Gastrointestinal: Negative.   Endocrine: Negative.   Genitourinary: Negative.   Musculoskeletal: Positive for arthralgias and joint swelling.  Skin: Negative for rash.  Allergic/Immunologic: Negative.   Neurological: Negative.   Hematological: Negative.   Psychiatric/Behavioral: Negative.     Allergies  Cefaclor  Home Medications   Prior to Admission medications   Medication Sig Start Date End Date Taking? Authorizing Provider  amoxicillin-clavulanate (AUGMENTIN) 875-125 MG tablet Take 1 tablet by mouth every 12 (twelve) hours. Patient not taking: Reported on 10/22/2016 08/23/16   Tharon AquasFrank C Patrick, PA  amphetamine-dextroamphetamine (ADDERALL XR) 30 MG 24 hr capsule Take 1 capsule (30 mg total) by mouth every morning. Patient not taking: Reported on 10/22/2016 07/01/14   Waldon MerlWilliam C Martin, PA-C  amphetamine-dextroamphetamine (ADDERALL) 10 MG tablet Take 1 tablet by mouth each day around 3PM Patient not taking: Reported on 10/22/2016 08/03/14   Waldon MerlWilliam C Martin, PA-C  clonazePAM (KLONOPIN) 1 MG tablet Take 1 tablet (1 mg total) by mouth 2 (two) times daily as needed for anxiety. Patient not taking: Reported on 10/22/2016 08/03/14 05/15/16  Waldon MerlWilliam C Martin, PA-C  cyclobenzaprine (FLEXERIL) 10 MG tablet Take 1 tablet (10 mg total) by mouth at bedtime. Take  one at bedtime for muscle relaxant. Patient not taking: Reported on 10/22/2016 07/01/14   Waldon MerlWilliam C Martin, PA-C  ibuprofen (ADVIL,MOTRIN) 200 MG tablet Take 200 mg by mouth every 6 (six) hours as needed.    Historical Provider, MD  pantoprazole (PROTONIX) 20 MG tablet Take 20 mg by mouth daily as needed. 07/11/12 07/01/14  Linna HoffJames D Kindl, MD   Meds Ordered and Administered this Visit  Medications - No data to display  BP 121/71 (BP Location: Right Arm)   Pulse 84   Temp 98.3 F (36.8 C) (Oral)   Resp 16   SpO2 100%   No data found.   Physical Exam  Constitutional: He appears well-developed and well-nourished. No distress.  HENT:  Head: Normocephalic and atraumatic.  Eyes: Right eye exhibits no discharge. Left eye exhibits no discharge. No scleral icterus.  Cardiovascular: Normal rate, regular rhythm and normal heart sounds.  Exam reveals no gallop and no friction rub.   No murmur heard. Pulmonary/Chest: Effort normal and breath sounds normal. No respiratory distress. He has no wheezes. He has no rales.  Musculoskeletal:  Right index finger normal to inspection. Small nodule noted at the dorsal aspect, just distal to the PIP joint. Full range of motion. Normal isolated movements at the DIP and PIP joints. 2 point discrimination intact. Capillary refill ~1-2 seconds.   Skin: Skin is warm. Capillary refill takes less than 2 seconds. He is not diaphoretic.    Urgent Care Course   Clinical Course     Procedures (including critical care time)  Labs Review Labs Reviewed - No data to display  Imaging Review Dg Finger Index Right  Result Date: 10/22/2016 CLINICAL DATA:  Crushed finger on saw stand 3.5 weeks ago, persistent pain and swelling. EXAM: RIGHT INDEX FINGER 2+V COMPARISON:  None. FINDINGS: There is no evidence of fracture or dislocation. There is no evidence of arthropathy or other focal bone abnormality. Soft tissues are unremarkable. IMPRESSION: Negative. Electronically Signed   By: Awilda Metroourtnay  Bloomer M.D.   On: 10/22/2016 14:31       MDM   1. Sprain of right index finger, unspecified site of finger, initial encounter    This is a 30 y/o male presenting 3-3.5 weeks after a crush injury to his right index finger. He states that the pain has slowly began to improve. He notes numbness/tingling over the dorsal aspect of that finger but 2 point discrimination is intact on exam and he has good capillary refill. No evidence of fracture on X-ray. Tendons appear to be intact on exam. Discussed  this could be secondary temporary nerve damage from injury. Given he intermittently has pain with flexion, his fingers were buddy taped. He was advised to continue Ibuprofen or Tylenol PRN pain.  He was advised to f/u if his symptoms worsen or fail to improve.     Joanna Puffrystal S Demetrica Zipp, MD 10/22/16 [phone]

## 2016-10-22 NOTE — ED Notes (Signed)
Notified dr Chaney Mallingmortenson of repeat vital signs

## 2017-07-04 ENCOUNTER — Ambulatory Visit (HOSPITAL_COMMUNITY)
Admission: EM | Admit: 2017-07-04 | Discharge: 2017-07-04 | Disposition: A | Discharge status: Home / Self Care | Payer: Self-pay | Attending: Family Medicine | Admitting: Family Medicine

## 2017-07-04 ENCOUNTER — Encounter (HOSPITAL_COMMUNITY): Payer: Self-pay | Admitting: Family Medicine

## 2017-07-04 DIAGNOSIS — K0889 Other specified disorders of teeth and supporting structures: Secondary | ICD-10-CM

## 2017-07-04 MED ORDER — CLINDAMYCIN HCL 150 MG PO CAPS: 150.0000 mg | ORAL_CAPSULE | Freq: Three times a day (TID) | ORAL | 0 refills | Status: DC

## 2017-07-04 MED ORDER — HYDROCODONE-ACETAMINOPHEN 5-325 MG PO TABS: 1.0000 | ORAL_TABLET | Freq: Four times a day (QID) | ORAL | 0 refills | Status: DC | PRN

## 2017-07-04 NOTE — ED Triage Notes (Signed)
Pt here for right upper dental pain and possible abscess.

## 2017-07-07 NOTE — ED Provider Notes (Signed)
  [facility]   [id number] 07/04/17 Arrival Time: 1926  ASSESSMENT & PLAN:  1. Pain, dental     Meds ordered this encounter  Medications  . HYDROcodone-acetaminophen (NORCO/VICODIN) 5-325 MG tablet    Sig: Take 1 tablet by mouth every 6 (six) hours as needed for moderate pain or severe pain.    Dispense:  10 tablet    Refill:  0  . clindamycin (CLEOCIN) 150 MG capsule    Sig: Take 1 capsule (150 mg total) by mouth 3 (three) times daily.    Dispense:  21 capsule    Refill:  0   Will f/u with dentist if not showing improvement within 48 hours.  Reviewed expectations re: course of current medical issues. Questions answered. Outlined signs and symptoms indicating need for more acute intervention. Patient verbalized understanding. After Visit Summary given.   SUBJECTIVE:  Alfred Woodard is a 31 y.o. male who presents with complaint of R upper dental pain for several days. Does not see dentist regularly. Pain increasing. Tolerating PO intake. Afebrile. Chewing on R makes pain worse. No drainage. OTC analgesics without relief.  ROS: As per HPI.   OBJECTIVE:  Vitals:   07/04/17 1946  BP: 140/83  Pulse: 80  Resp: 18  Temp: 98.3 F (36.8 C)  SpO2: 98%     General appearance: alert; no distress HEENT: teeth in poor repair; reports tenderness over R upper gum; no area of fluctuance; no drainage Skin: warm and dry     Allergies  Allergen Reactions  . Cefaclor Other (See Comments)    childhood    PMHx, SurgHx, SocialHx, Medications, and Allergies were reviewed in the Visit Navigator and updated as appropriate.      Mardella LaymanHagler, Aarian Griffie, MD 07/07/17 1540

## 2018-01-21 ENCOUNTER — Encounter (HOSPITAL_COMMUNITY): Payer: Self-pay | Admitting: Family Medicine

## 2018-01-21 ENCOUNTER — Ambulatory Visit (HOSPITAL_COMMUNITY)
Admission: EM | Admit: 2018-01-21 | Discharge: 2018-01-21 | Disposition: A | Discharge status: Home / Self Care | Payer: Self-pay | Attending: Internal Medicine | Admitting: Internal Medicine

## 2018-01-21 DIAGNOSIS — K0889 Other specified disorders of teeth and supporting structures: Secondary | ICD-10-CM

## 2018-01-21 MED ORDER — CLINDAMYCIN HCL 150 MG PO CAPS: 150.0000 mg | ORAL_CAPSULE | Freq: Three times a day (TID) | ORAL | 0 refills | Status: AC

## 2018-01-21 MED ORDER — HYDROCODONE-ACETAMINOPHEN 5-325 MG PO TABS
1.0000 | ORAL_TABLET | Freq: Once | ORAL | Status: AC
  Administered 2018-01-21: 1 via ORAL

## 2018-01-21 MED ORDER — HYDROCODONE-ACETAMINOPHEN 5-325 MG PO TABS
ORAL_TABLET | ORAL | Status: AC
  Filled 2018-01-21: qty 1

## 2018-01-21 MED ORDER — IBUPROFEN 800 MG PO TABS: 800.0000 mg | ORAL_TABLET | Freq: Three times a day (TID) | ORAL | 0 refills | Status: DC

## 2018-01-21 NOTE — Discharge Instructions (Signed)
Continue with ibuprofen three times a day as needed for pain, ice application.  Initiate antibiotics. Please follow up with dentist for definitive treatment of pain.

## 2018-01-21 NOTE — ED Provider Notes (Signed)
[facility]    CSN: [id number] Arrival date & time: 01/21/18  1809     History   Chief Complaint Chief Complaint  Patient presents with  . Dental Pain    HPI Alfred Woodard is a 32 y.o. male.   Alfred Woodard presents with complaints of right upper jaw and left lower jaw pain which has been ongoing for 2 years and worsened again over the past few days. States he has been told he needs root canals and crowns but he decided not to do this and is going to have teeth pulled. Swelling. Has been taking ibuprofen for pain control with minimal relief. Denies any drainage or pus.    ROS per HPI.       Past Medical History:  Diagnosis Date  . Acute bronchitis   . Bipolar disorder, unspecified (HCC)   . Chicken pox   . Chronic back pain   . Depression    suicide attempt in 2004, psychiatric admis  . Erectile dysfunction   . Polysubstance abuse (HCC)   . Syncope and collapse   . Tobacco use disorder     Patient Active Problem List   Diagnosis Date Noted  . Visit for preventive health examination 04/28/2014  . Hx of drug abuse 04/28/2014  . ADD (attention deficit disorder) 04/28/2014  . Thrombocytopenia (HCC) 07/13/2012  . Alcohol intoxication (HCC) 07/13/2012  . BIPOLAR DISORDER UNSPECIFIED 03/25/2009  . ERECTILE DYSFUNCTION 03/25/2009  . TOBACCO ABUSE 03/25/2009  . SYNCOPE 03/25/2009    Past Surgical History:  Procedure Laterality Date  . WISDOM TOOTH EXTRACTION         Home Medications    Prior to Admission medications   Medication Sig Start Date End Date Taking? Authorizing Provider  amphetamine-dextroamphetamine (ADDERALL XR) 30 MG 24 hr capsule Take 1 capsule (30 mg total) by mouth every morning. Patient not taking: Reported on 10/22/2016 07/01/14   Waldon Merl, PA-C  amphetamine-dextroamphetamine (ADDERALL) 10 MG tablet Take 1 tablet by mouth each day around 3PM Patient not taking: Reported on 10/22/2016 08/03/14   Waldon Merl, PA-C    clindamycin (CLEOCIN) 150 MG capsule Take 1 capsule (150 mg total) by mouth 3 (three) times daily for 7 days. 01/21/18 01/28/18  Georgetta Haber, NP  clonazePAM (KLONOPIN) 1 MG tablet Take 1 tablet (1 mg total) by mouth 2 (two) times daily as needed for anxiety. Patient not taking: Reported on 10/22/2016 08/03/14 05/15/16  Waldon Merl, PA-C  ibuprofen (ADVIL,MOTRIN) 800 MG tablet Take 1 tablet (800 mg total) by mouth 3 (three) times daily. 01/21/18   Georgetta Haber, NP  pantoprazole (PROTONIX) 20 MG tablet Take 20 mg by mouth daily as needed. 07/11/12 07/01/14  Linna Hoff, MD    Family History Family History  Problem Relation Age of Onset  . Anxiety disorder Mother        Living  . Bipolar disorder Father 73       Deceased  . Schizophrenia Father   . ADD / ADHD Brother        x2    Social History Social History   Tobacco Use  . Smoking status: Current Every Day Smoker    Packs/day: 0.50    Years: 15.00    Pack years: 7.50  . Smokeless tobacco: Never Used  Substance Use Topics  . Alcohol use: Yes    Alcohol/week: 3.0 oz    Types: 5 Cans of beer per week  . Drug use:  No    Comment: has + history of drug abuse     Allergies   Cefaclor   Review of Systems Review of Systems   Physical Exam Triage Vital Signs ED Triage Vitals  Enc Vitals Group     BP 01/21/18 1851 118/63     Pulse Rate 01/21/18 1851 80     Resp 01/21/18 1851 18     Temp 01/21/18 1851 98.6 F (37 C)     Temp src --      SpO2 01/21/18 1851 99 %     Weight --      Height --      Head Circumference --      Peak Flow --      Pain Score 01/21/18 1849 6     Pain Loc --      Pain Edu? --      Excl. in GC? --    No data found.  Updated Vital Signs BP 118/63   Pulse 80   Temp 98.6 F (37 C)   Resp 18   SpO2 99%   Visual Acuity Right Eye Distance:   Left Eye Distance:   Bilateral Distance:    Right Eye Near:   Left Eye Near:    Bilateral Near:     Physical Exam   Constitutional: He is oriented to person, place, and time. He appears well-developed and well-nourished.  HENT:  Right Ear: External ear normal.  Left Ear: External ear normal.  Mouth/Throat: Dental caries present.    Multiple small scattered caries; large broken and eroded molar to left upper jaw; tenderness at jaw line to right upper and left lower jaw  Cardiovascular: Normal rate and regular rhythm.  Pulmonary/Chest: Effort normal and breath sounds normal.  Neurological: He is alert and oriented to person, place, and time.  Skin: Skin is warm and dry.     UC Treatments / Results  Labs (all labs ordered are listed, but only abnormal results are displayed) Labs Reviewed - No data to display  EKG  EKG Interpretation None       Radiology No results found.  Procedures Procedures (including critical care time)  Medications Ordered in UC Medications  HYDROcodone-acetaminophen (NORCO/VICODIN) 5-325 MG per tablet 1 tablet (not administered)     Initial Impression / Assessment and Plan / UC Course  I have reviewed the triage vital signs and the nursing notes.  Pertinent labs & imaging results that were available during my care of the patient were reviewed by me and considered in my medical decision making (see chart for details).     Hydrocodone in clinic provided today per patient request. Complete course of antibiotics.  Continue with ibuprofen as needed for pain. Ice application. Follow up with dentist for definitive treatment. Patient verbalized understanding and agreeable to plan.    Final Clinical Impressions(s) / UC Diagnoses   Final diagnoses:  Pain, dental    ED Discharge Orders        Ordered    clindamycin (CLEOCIN) 150 MG capsule  3 times daily     01/21/18 1920    ibuprofen (ADVIL,MOTRIN) 800 MG tablet  3 times daily     01/21/18 1921       Controlled Substance Prescriptions [place] Controlled Substance Registry consulted? Not Applicable   Georgetta HaberBurky,  Natalie B, NP 01/21/18 1926

## 2018-01-21 NOTE — ED Triage Notes (Signed)
Pt here for multiple abscesses around teeth.

## 2018-12-29 ENCOUNTER — Ambulatory Visit (INDEPENDENT_AMBULATORY_CARE_PROVIDER_SITE_OTHER): Payer: Self-pay

## 2018-12-29 ENCOUNTER — Other Ambulatory Visit: Payer: Self-pay

## 2018-12-29 ENCOUNTER — Encounter (HOSPITAL_COMMUNITY): Payer: Self-pay

## 2018-12-29 ENCOUNTER — Ambulatory Visit (HOSPITAL_COMMUNITY)
Admission: EM | Admit: 2018-12-29 | Discharge: 2018-12-29 | Disposition: A | Discharge status: Home / Self Care | Payer: Self-pay | Attending: Family Medicine | Admitting: Family Medicine

## 2018-12-29 DIAGNOSIS — J069 Acute upper respiratory infection, unspecified: Secondary | ICD-10-CM

## 2018-12-29 MED ORDER — KETOROLAC TROMETHAMINE 30 MG/ML IJ SOLN
30.0000 mg | Freq: Once | INTRAMUSCULAR | Status: AC
  Administered 2018-12-29: 30 mg via INTRAMUSCULAR

## 2018-12-29 MED ORDER — KETOROLAC TROMETHAMINE 30 MG/ML IJ SOLN
INTRAMUSCULAR | Status: AC
  Filled 2018-12-29: qty 1

## 2018-12-29 MED ORDER — BENZONATATE 100 MG PO CAPS: 100.0000 mg | ORAL_CAPSULE | Freq: Three times a day (TID) | ORAL | 0 refills | Status: DC

## 2018-12-29 NOTE — Discharge Instructions (Addendum)
Your x-ray was negative for pneumonia or bronchitis This is most likely a viral upper respiratory infection You can take ibuprofen 800 g every 8 hours as needed for body aches and fever Toradol injection given here in clinic for body aches and headache Tessalon Perles for cough every 8 hours as needed

## 2018-12-29 NOTE — ED Provider Notes (Signed)
[facility]    CSN: [id number] Arrival date & time: 12/29/18  1535     History   Chief Complaint Chief Complaint  Patient presents with  . Influenza    HPI Alfred Woodard is a 33 y.o. male.   Patient is a 33 year old male  with past medical history of acute bronchitis, bipolar, chickenpox, depression, polysubstance abuse, tobacco use that presents with approximately 3 weeks of worsening cough, congestion, body aches, chills, night sweats. Cough has been productive and no productive at times.   His kids have been sick with similar symptoms.  He denies any recent traveling.  He is a current everyday smoker.  He denies any nausea, vomiting, diarrhea. He has had some post tussive vomiting.  He has had decrease in appetite but has been drinking fluids.  ROS per HPI      Past Medical History:  Diagnosis Date  . Acute bronchitis   . Bipolar disorder, unspecified (HCC)   . Chicken pox   . Chronic back pain   . Depression    suicide attempt in 2004, psychiatric admis  . Erectile dysfunction   . Polysubstance abuse (HCC)   . Syncope and collapse   . Tobacco use disorder     Patient Active Problem List   Diagnosis Date Noted  . Visit for preventive health examination 04/28/2014  . Hx of drug abuse (HCC) 04/28/2014  . ADD (attention deficit disorder) 04/28/2014  . Thrombocytopenia (HCC) 07/13/2012  . Alcohol intoxication (HCC) 07/13/2012  . BIPOLAR DISORDER UNSPECIFIED 03/25/2009  . ERECTILE DYSFUNCTION 03/25/2009  . TOBACCO ABUSE 03/25/2009  . SYNCOPE 03/25/2009    Past Surgical History:  Procedure Laterality Date  . WISDOM TOOTH EXTRACTION         Home Medications    Prior to Admission medications   Medication Sig Start Date End Date Taking? Authorizing Provider  benzonatate (TESSALON) 100 MG capsule Take 1 capsule (100 mg total) by mouth every 8 (eight) hours. 12/29/18   Dahlia ByesBast, Bethannie Iglehart A, NP  ibuprofen (ADVIL,MOTRIN) 800 MG tablet Take 1 tablet (800 mg  total) by mouth 3 (three) times daily. 01/21/18   Georgetta HaberBurky, Natalie B, NP    Family History Family History  Problem Relation Age of Onset  . Anxiety disorder Mother        Living  . Bipolar disorder Father [id number]       Deceased  . Schizophrenia Father   . ADD / ADHD Brother        x2    Social History Social History   Tobacco Use  . Smoking status: Current Every Day Smoker    Packs/day: 0.50    Years: 15.00    Pack years: 7.50  . Smokeless tobacco: Never Used  Substance Use Topics  . Alcohol use: Yes    Alcohol/week: 5.0 standard drinks    Types: 5 Cans of beer per week  . Drug use: No    Comment: has + history of drug abuse     Allergies   Cefaclor   Review of Systems Review of Systems   Physical Exam Triage Vital Signs ED Triage Vitals  Enc Vitals Group     BP 12/29/18 1606 113/63     Pulse Rate 12/29/18 1606 98     Resp 12/29/18 1606 16     Temp 12/29/18 1606 98.1 F (36.7 C)     Temp Source 12/29/18 1606 Oral     SpO2 12/29/18 1606 99 %  Weight 12/29/18 1602 150 lb (68 kg)     Height --      Head Circumference --      Peak Flow --      Pain Score 12/29/18 1602 8     Pain Loc --      Pain Edu? --      Excl. in GC? --    No data found.  Updated Vital Signs BP 113/63 (BP Location: Right Arm)   Pulse 98   Temp 98.1 F (36.7 C) (Oral)   Resp 16   Wt 150 lb (68 kg)   SpO2 99%   BMI 20.63 kg/m   Visual Acuity Right Eye Distance:   Left Eye Distance:   Bilateral Distance:    Right Eye Near:   Left Eye Near:    Bilateral Near:     Physical Exam Vitals signs and nursing note reviewed.  Constitutional:      Appearance: He is well-developed. He is ill-appearing.  HENT:     Head: Normocephalic and atraumatic.     Right Ear: Tympanic membrane and ear canal normal.     Left Ear: Tympanic membrane and ear canal normal.     Nose: Congestion and rhinorrhea present.     Mouth/Throat:     Pharynx: Oropharynx is clear.  Eyes:      Conjunctiva/sclera: Conjunctivae normal.  Neck:     Musculoskeletal: Neck supple. No neck rigidity or muscular tenderness.     Vascular: No carotid bruit.  Cardiovascular:     Rate and Rhythm: Normal rate and regular rhythm.     Heart sounds: No murmur.  Pulmonary:     Effort: Pulmonary effort is normal. No respiratory distress.     Breath sounds: Normal breath sounds.     Comments: Decreased lung sounds in all fields.  Abdominal:     Palpations: Abdomen is soft.     Tenderness: There is no abdominal tenderness.  Musculoskeletal: Normal range of motion.  Lymphadenopathy:     Cervical: No cervical adenopathy.  Skin:    General: Skin is warm and dry.  Neurological:     Mental Status: He is alert.  Psychiatric:        Mood and Affect: Mood normal.      UC Treatments / Results  Labs (all labs ordered are listed, but only abnormal results are displayed) Labs Reviewed - No data to display  EKG None  Radiology Dg Chest 2 View  Result Date: 12/29/2018 CLINICAL DATA:  Productive cough, shortness of breath. EXAM: CHEST - 2 VIEW COMPARISON:  None. FINDINGS: The heart size and mediastinal contours are within normal limits. Both lungs are clear. No pneumothorax or pleural effusion is noted. The visualized skeletal structures are unremarkable. IMPRESSION: No active cardiopulmonary disease. Electronically Signed   By: Lupita RaiderJames  Green Jr, M.D.   On: 12/29/2018 17:09    Procedures Procedures (including critical care time)  Medications Ordered in UC Medications  ketorolac (TORADOL) 30 MG/ML injection 30 mg (30 mg Intramuscular Given 12/29/18 1728)    Initial Impression / Assessment and Plan / UC Course  I have reviewed the triage vital signs and the nursing notes.  Pertinent labs & imaging results that were available during my care of the patient were reviewed by me and considered in my medical decision making (see chart for details).     Chest x-ray was negative for any bronchitis  or pneumonia. Exam consistent with viral illness Tessalon Perles for cough and congestion  Toradol injection given here for headache and body aches You take Tylenol or ibuprofen as needed for body aches and fever Follow up as needed for continued or worsening symptoms   Final Clinical Impressions(s) / UC Diagnoses   Final diagnoses:  Viral upper respiratory tract infection     Discharge Instructions     Your x-ray was negative for pneumonia or bronchitis This is most likely a viral upper respiratory infection You can take ibuprofen 800 g every 8 hours as needed for body aches and fever Toradol injection given here in clinic for body aches and headache Tessalon Perles for cough every 8 hours as needed      ED Prescriptions    Medication Sig Dispense Auth. Provider   benzonatate (TESSALON) 100 MG capsule Take 1 capsule (100 mg total) by mouth every 8 (eight) hours. 21 capsule Dahlia Byes A, NP     Controlled Substance Prescriptions [place] Controlled Substance Registry consulted? Not Applicable   Janace Aris, NP 12/29/18 [id number]

## 2018-12-29 NOTE — ED Triage Notes (Signed)
Pt cc headache, cough, body aches, chills and diarrhea, x 3 weeks.

## 2019-02-23 ENCOUNTER — Ambulatory Visit
Admission: EM | Admit: 2019-02-23 | Discharge: 2019-02-23 | Disposition: A | Discharge status: Home / Self Care | Payer: Self-pay | Attending: Nurse Practitioner | Admitting: Nurse Practitioner

## 2019-02-23 DIAGNOSIS — K047 Periapical abscess without sinus: Secondary | ICD-10-CM

## 2019-02-23 MED ORDER — CLINDAMYCIN HCL 300 MG PO CAPS: 300.0000 mg | ORAL_CAPSULE | Freq: Three times a day (TID) | ORAL | 0 refills | Status: AC

## 2019-02-23 NOTE — ED Triage Notes (Signed)
Pt c/o toothache to lt lower, now having severe pain and swelling

## 2019-02-23 NOTE — Discharge Instructions (Signed)
Take medications as directed Gargle with Listerine after meals and before bedtime  Avoid spicy foods  Don't use straws  Eat soft foods  Follow up with dentist after completing antibiotics

## 2019-02-23 NOTE — ED Provider Notes (Signed)
[name]    CSN: [id number] Arrival date & time: 02/23/19  1953     History   Chief Complaint Chief Complaint  Patient presents with  . Dental Pain    HPI Alfred Woodard is a 33 y.o. male.   History of Present Illness  Alfred Woodard is a 33 y.o. male who presents with complaint of toothache. Onset of symptoms was several months ago but has worsened over the past 4-5 days. Patient describes pain as sharp/stabbing and throbbing. Pain severity now is 5 /10. The pain does radiate into the right lower jaw. Patient denies jaw swelling or fever >101. Pain is aggravated by use. Pain is alleviated by nothing. The patient denies other complaints. Patient has not sought treatment by another care provider for this problem. Care prior to arrival consisted of nothing        Past Medical History:  Diagnosis Date  . Acute bronchitis   . Bipolar disorder, unspecified (HCC)   . Chicken pox   . Chronic back pain   . Depression    suicide attempt in 2004, psychiatric admis  . Erectile dysfunction   . Polysubstance abuse (HCC)   . Syncope and collapse   . Tobacco use disorder     Patient Active Problem List   Diagnosis Date Noted  . Visit for preventive health examination 04/28/2014  . Hx of drug abuse (HCC) 04/28/2014  . ADD (attention deficit disorder) 04/28/2014  . Thrombocytopenia (HCC) 07/13/2012  . Alcohol intoxication (HCC) 07/13/2012  . BIPOLAR DISORDER UNSPECIFIED 03/25/2009  . ERECTILE DYSFUNCTION 03/25/2009  . TOBACCO ABUSE 03/25/2009  . SYNCOPE 03/25/2009    Past Surgical History:  Procedure Laterality Date  . WISDOM TOOTH EXTRACTION         Home Medications    Prior to Admission medications   Medication Sig Start Date End Date Taking? Authorizing Provider  benzonatate (TESSALON) 100 MG capsule Take 1 capsule (100 mg total) by mouth every 8 (eight) hours. 12/29/18   Dahlia Byes A, NP  clindamycin (CLEOCIN) 300 MG capsule Take 1 capsule (300 mg  total) by mouth 3 (three) times daily for 7 days. 02/23/19 03/02/19  Lurline Idol, FNP  ibuprofen (ADVIL,MOTRIN) 800 MG tablet Take 1 tablet (800 mg total) by mouth 3 (three) times daily. 01/21/18   Georgetta Haber, NP    Family History Family History  Problem Relation Age of Onset  . Anxiety disorder Mother        Living  . Bipolar disorder Father 17       Deceased  . Schizophrenia Father   . ADD / ADHD Brother        x2    Social History Social History   Tobacco Use  . Smoking status: Current Every Day Smoker    Packs/day: 0.50    Years: 15.00    Pack years: 7.50  . Smokeless tobacco: Never Used  Substance Use Topics  . Alcohol use: Yes    Alcohol/week: 5.0 standard drinks    Types: 5 Cans of beer per week  . Drug use: No    Comment: has + history of drug abuse     Allergies   Cefaclor   Review of Systems Review of Systems  Constitutional: Negative.   HENT: Positive for dental problem.   All other systems reviewed and are negative.    Physical Exam Triage Vital Signs ED Triage Vitals  Enc Vitals Group     BP 02/23/19 2002 Marland Kitchen)  146/78     Pulse Rate 02/23/19 2002 (!) 105     Resp 02/23/19 2002 16     Temp 02/23/19 2002 98.1 F (36.7 C)     Temp Source 02/23/19 2002 Oral     SpO2 02/23/19 2002 95 %     Weight --      Height --      Head Circumference --      Peak Flow --      Pain Score 02/23/19 2001 5     Pain Loc --      Pain Edu? --      Excl. in GC? --    No data found.  Updated Vital Signs BP (!) 146/78 (BP Location: Left Arm)   Pulse (!) 105   Temp 98.1 F (36.7 C) (Oral)   Resp 16   SpO2 95%   Visual Acuity Right Eye Distance:   Left Eye Distance:   Bilateral Distance:    Right Eye Near:   Left Eye Near:    Bilateral Near:     Physical Exam Vitals signs reviewed.  Constitutional:      General: He is not in acute distress.    Appearance: Normal appearance. He is not ill-appearing, toxic-appearing or diaphoretic.  HENT:      Head: Normocephalic.     Mouth/Throat:     Lips: Pink.     Mouth: Mucous membranes are moist.     Dentition: Dental caries present. No gingival swelling, dental abscesses or gum lesions.     Pharynx: Oropharynx is clear.      Comments: Dental caries and fractured tooth noted on the molar as marked above. No dental abscess or gingival swelling noted.  Neck:     Musculoskeletal: Normal range of motion and neck supple. No neck rigidity.  Cardiovascular:     Rate and Rhythm: Normal rate and regular rhythm.  Pulmonary:     Effort: Pulmonary effort is normal.     Breath sounds: Normal breath sounds.  Musculoskeletal: Normal range of motion.  Skin:    General: Skin is warm and dry.  Neurological:     General: No focal deficit present.     Mental Status: He is alert and oriented to person, place, and time.  Psychiatric:        Mood and Affect: Mood normal.        Behavior: Behavior normal.      UC Treatments / Results  Labs (all labs ordered are listed, but only abnormal results are displayed) Labs Reviewed - No data to display  EKG None  Radiology No results found.  Procedures Procedures (including critical care time)  Medications Ordered in UC Medications - No data to display  Initial Impression / Assessment and Plan / UC Course  I have reviewed the triage vital signs and the nursing notes.  Pertinent labs & imaging results that were available during my care of the patient were reviewed by me and considered in my medical decision making (see chart for details).     33 year old male presenting with a history of left lower dental pain for the past several months that has been worse over the past several days.  On exam, patient noted to have dental caries/fractured tooth.  No jaw swelling noted.  Patient is alert and oriented x3.  Afebrile.  Nontoxic-appearing.  Placed on a seven-day course of clindamycin. Advised to gargle with Listerine after meals and before bedtime,  avoid spicy foods, don't use  straws, eat soft foods and follow up with dentistry after completing antibiotics.   Today's evaluation has revealed no signs of a dangerous process. Discussed diagnosis with patient. Patient aware of their diagnosis, possible red flag symptoms to watch out for and need for close follow up. Patient understands verbal and written discharge instructions. Patient comfortable with plan and disposition.  Patient has a clear mental status at this time, good insight into illness (after discussion and teaching) and has clear judgment to make decisions regarding their care.  Documentation was completed with the aid of voice recognition software. Transcription may contain typographical errors. Final Clinical Impressions(s) / UC Diagnoses   Final diagnoses:  Dental infection     Discharge Instructions     Take medications as directed Gargle with Listerine after meals and before bedtime  Avoid spicy foods  Don't use straws  Eat soft foods  Follow up with dentist after completing antibiotics     ED Prescriptions    Medication Sig Dispense Auth. Provider   clindamycin (CLEOCIN) 300 MG capsule Take 1 capsule (300 mg total) by mouth 3 (three) times daily for 7 days. 21 capsule Lurline Idol, FNP     Controlled Substance Prescriptions [place] Controlled Substance Registry consulted? Not Applicable   Lurline Idol, FNP 02/23/19 2030

## 2020-07-07 NOTE — ED Provider Notes (Signed)
 Patient placed in [facility] Look pathway, seen and evaluated for chief complaint of requesting detox from opiods. Pertinent HPI findings include patient uses IV fentanyl. He reports last using earlier today. Patient endorses ETOH consumption, last used this am. Patient denies suicidal or homicidal ideation, depression or any other medical complaints at this time. Pertinent exam findings include non-toxic in appearance, heart with regular rate and rhythm and lungs are clear to ascultation bilaterally.   Based on initial evaluation, labs are currently indicated and radiology studies are not currently indicated. Patient counseled on process, plan, and necessity for staying for completing the evaluation.   This document serves as a record of services personally performed by Arloa Planas NP. It was created on their behalf by [employer], a trained medical scribe. The creation of this record is the provider's dictation and/or activities during the visit.  ------------------------------------------------------------------------------------------------------- [facility] Emergency Department Emergency Department Provider Note  History   Chief Complaint Drug / Alcohol Assessment   History of Present Illness  History obtained from: patient  Provider at Bedside:  07/08/2020 5:47 AM  Alfred Woodard is a 34 y.o. male who presents to the ED requesting detox from fentanyl. States he has been using for 2 years.  States he uses 500 to 1000 mcg/day states he last used 2 days ago. States he is having the following withdrawal symptoms: sneezing, nausea, vomiting, chills, and sweats. He reports that he also uses benzodiazepines, cannabis, and methamphetamine intermittently. Denies any suicidal ideation, homicidal ideation, hallucinations, or physical complaints.    Past Medical History History reviewed. No pertinent past medical history.  Past Surgical History History reviewed. No pertinent surgical  history.  Current Medications No current outpatient medications on file.    Allergies Allergies  Allergen Reactions  . Ceclor [Cefaclor] Other (See Comments)    .    Family History History reviewed. No pertinent family history.   Review of Systems   Review of Systems  Constitutional: Positive for chills and diaphoresis. Negative for fever.  HENT: Positive for sneezing. Negative for ear pain, rhinorrhea, sinus pressure and sore throat.   Eyes: Negative for discharge and visual disturbance.  Respiratory: Negative for cough, shortness of breath and wheezing.   Cardiovascular: Negative for chest pain and leg swelling.  Gastrointestinal: Positive for nausea and vomiting. Negative for abdominal pain and diarrhea.  Genitourinary: Negative for dysuria, flank pain and hematuria.  Musculoskeletal: Negative for arthralgias, back pain, joint swelling, myalgias and neck pain.  Skin: Negative for rash and wound.  Allergic/Immunologic: Negative for food allergies.  Neurological: Negative for dizziness, weakness and headaches.  Psychiatric/Behavioral: Negative for hallucinations and suicidal ideas.  All other systems reviewed and are negative.   Physical Exam   BP 132/74   Pulse 87   Temp 97.5 F (36.4 C) (Oral)   Resp 16   Wt 68 kg (150 lb)   SpO2 97%    Physical Exam Vitals and nursing note reviewed.  Constitutional:      General: He is not in acute distress.    Appearance: He is well-developed. He is not diaphoretic.  HENT:     Head: Normocephalic and atraumatic.  Eyes:     Conjunctiva/sclera: Conjunctivae normal.  Cardiovascular:     Rate and Rhythm: Normal rate.  Pulmonary:     Effort: Pulmonary effort is normal.  Musculoskeletal:     Cervical back: Normal range of motion.  Skin:    General: Skin is warm and dry.  Neurological:  Mental Status: He is alert and oriented to person, place, and time.  Psychiatric:        Attention and Perception: Attention and  perception normal.        Mood and Affect: Mood and affect normal.        Speech: Speech normal.        Behavior: Behavior normal. Behavior is cooperative.        Thought Content: Thought content normal. Thought content is not paranoid or delusional. Thought content does not include homicidal or suicidal ideation.        Cognition and Memory: Cognition and memory normal.        Judgment: Judgment normal.     Labs   Recent Results (from the past 72 hour(s))  Hold For Urine Culture  Result Value Ref Range   CULTURE,URINE HOLD SAMPLE Culture tube received   COVID Antigen   Specimen: Nasal Swab  Result Value Ref Range   COVID ANTIGEN Negative Negative  Light Blue  Result Value Ref Range   EXTRA TUBE HOLD SPECIMEN-BLUE Extra tube received   Gold  Result Value Ref Range   EXTRA TUBE HOLD SPECIMEN Extra tube received   CBC and Differential  Result Value Ref Range   WBC 6.7 4.4 - 11.0 x 10*3/uL   RBC 4.88 4.50 - 5.90 x 10*6/uL   Hemoglobin 14.8 14.0 - 17.5 G/DL   Hematocrit 55.7 58.4 - 50.4 %   MCV 90.7 80.0 - 96.0 FL   MCH 30.3 27.5 - 33.2 PG   MCHC 33.4 33.0 - 37.0 G/DL   RDW 85.9 87.6 - 82.9 %   Platelets 114 (L) 150 - 450 X 10*3/uL   MPV 10.2 6.8 - 10.2 FL   Neutrophil % 70 %   Lymphocyte % 24 %   Monocyte % 5 %   Eosinophil % 1 %   Basophil % 0 %   Neutrophil Absolute 4.7 1.8 - 7.8 x 10*3/uL   Lymphocyte Absolute 1.6 1.0 - 4.8 x 10*3/uL   Monocyte Absolute 0.3 0.0 - 0.8 x 10*3/uL   Eosinophil Absolute 0.0 0.0 - 0.5 x 10*3/uL   Basophil Absolute 0.0 0.0 - 0.2 x 10*3/uL  Extra Tube Draw   Narrative   The following orders were created for panel order Extra Tube Draw. Procedure                               Abnormality         Status                    ---------                               -----------         ------                    Hnoi[[id number]]                                             Final result              Light Aolz[[id number]]  Final result               Please view results for these tests on the individual orders.  Ethanol  Result Value Ref Range   Alcohol <10 <=10 MG/DL   Narrative   NOT FOR FORENSIC USE  Comprehensive Metabolic Panel  Result Value Ref Range   Sodium 140 135 - 146 MMOL/L   Potassium 3.9 3.5 - 5.3 MMOL/L   Chloride 104 98 - 110 MMOL/L   CO2 28 23 - 30 MMOL/L   BUN 9 8 - 24 MG/DL   Glucose 75 70 - 99 MG/DL   Creatinine 9.17 9.49 - 1.50 MG/DL   Calcium 89.9 8.5 - 89.4 MG/DL   Total Protein 7.6 6.0 - 8.3 G/DL   Albumin  4.5 3.5 - 5.0 G/DL   Total Bilirubin 0.4 0.1 - 1.2 MG/DL   Alkaline Phosphatase 75 25 - 125 IU/L or U/L   AST (SGOT) 39 5 - 40 IU/L or U/L   ALT (SGPT) 86 (H) 5 - 50 IU/L or U/L   Anion Gap 8 4 - 14 MMOL/L   Est. GFR Non-African American >=90 >=60 ML/MIN/1.73 M*2   CBC and Differential   Narrative   The following orders were created for panel order CBC and Differential. Procedure                               Abnormality         Status                    ---------                               -----------         ------                    CBC and Differential[[id number]]         Abnormal            Final result               Please view results for these tests on the individual orders.  Fentanyl  Result Value Ref Range   Fentanyl Positive (A) Negative   Narrative   Drug detection involves initial screening of samples for drugs, and in medical applications the screening tests results are directly used for medical evaluation. The screening procedure essentially eliminates all negatives, and positive results are regarded as presumptive and require confirmation using confirmatory methods such as high performance liquid chromatography and mass spectrometry. Confirmatory methods offer an accurate detection of drugs, but due to cost and labor requirements their application to routine and large-scale analysis is limited.  Screening methods are employed to enable rapid  routine analysis of multiple sample. The samples are assigned as positive if the value is above a defined cut-off concentration.  Urinalysis With Microscopic  Result Value Ref Range   Urine Color Amber (A) Yellow   Urine Appearance Hazy (A) Clear   Urine Specific Gravity 1.024 1.005 - 1.030   Urine pH 6.0 5.0 - 8.0   Urine Leukocyte Esterase Trace (A) Negative   Urine Nitrite Negative Negative   Urine Protein 30  (A) Negative MG/DL   Urine Glucose Negative Negative MG/DL   Urine Ketone Negative Negative MG/DL   Urine Urobilinogen Negative Negative EU/DL  Urine Bilirubin Negative Negative   Urine Blood/Hb Negative Negative   Urine RBC 2 0 - 3 /HPF   Urine WBC 16 (H) 0 - 3 /HPF   Urine Bacteria Rare (A) None seen /HPF   Urine Mucus Rare (A) None seen /HPF  Urine Drug Screen  Result Value Ref Range   Amphetamines Positive (A) Negative, Indeterminate   Barbiturates Negative Negative, Indeterminate   Benzodiazepines Positive (A) Negative, Indeterminate   Cocaine Negative Negative, Indeterminate   Opiates Negative Negative, Indeterminate   Oxycodone Negative Negative, Indeterminate   THC Positive (A) Negative, Indeterminate   Methadone Negative Negative, Indeterminate   Tricyclic antidepressants Negative Negative, Indeterminate   U Creatinine Concentrate 372 MG/DL   Narrative   Drug detection involves initial screening of samples for drugs, and in medical applications the screening tests results are directly used for medical evaluation. The screening procedure essentially eliminates all negatives, and positive results are regarded as presumptive and require confirmation using confirmatory methods such as high performance liquid chromatography and mass spectrometry. Confirmatory methods offer an accurate detection of drugs, but due to cost and labor requirements their application to routine and large-scale analysis is limited.  Screening methods are employed to enable rapid routine  analysis of multiple sample. The samples are assigned as positive if the value is above a defined cut-off concentration.     EKG   none  Radiology   No results found for any visits on 07/07/20.  Procedures   Procedures  ED Course   07/08/2020 5:47 AM Discussed with patient findings of initial assessment. Discussed with patient plan of care which includes medical clearance, assessment team evaluation, disposition by psychiatry. Patient verbalizes understanding and in agreement to plan of care.   07/08/2020 5:52 AM Patient is stable and medically clear.  The patient has been placed in psychiatric observation due to the need to provide a safe environment for the patient while obtaining psychiatric consultation and evaluation, as well as ongoing medical and medication management to treat the patient's condition.  The patient has not been placed under full IVC at this time.    Medical Decision Making   In my opinion, this is a condition that a prudent lay person (who possesses an average knowledge of health and medicine) may expect to result in serious jeopardy, or cause serious impairment of bodily function, or serious dysfunction of bodily organs.  CBC ordered to assess for signs of infection and anemia. CMP ordered to assess for electrolyte imbalance, renal impairment, liver impairment, dehydration, hyperglycemia, and hypoglycemia. Urinalysis ordered to assess for urinary tract infection and dehydration. Drug screen ordered to assess for use of illicit substances. Ethanol level ordered to assess for level of alcohol intoxication. Fentanyl test ordered to assess for fentanyl.  Differential Diagnosis: Opioid abuse, opioid withdrawal, opioid dependence, polysubstance abuse  Assessment, and Plan   Pertinent labs & imaging results that were available during my care of the patient were reviewed by me and considered in my medical decision making (see chart for details).  I have reviewed  the triage vital signs and the nursing notes.  Impression:  Patient presented requesting detox from fentanyl.  Fentanyl test is positive.  CBC is unremarkable.  Alcohol level shows no intoxication.  CMP shows slight elevation in the ALT but otherwise normal hepatic function.  CMP is otherwise normal.  Urinalysis shows slightly concentrated urine with 16 white blood cells.  I have added on gonorrhea and Chlamydia testing.  Patient has no urinary symptoms  to suggest urinary tract infection.  Drug screen is positive for amphetamines, benzodiazepines, and cannabis.  Patient is not suicidal, homicidal, or psychotic.  He has been medically cleared and is currently awaiting assessment team evaluation and disposition by psychiatry.  ED Clinical Impression   1. Fentanyl dependence (HCC)   2. Polysubstance abuse [facility])         Electronically signed by: Lavanda Olam Franc, NP 07/08/20 [phone]

## 2020-07-08 NOTE — ED Provider Notes (Signed)
 EM Observation Re-evaluation note  Alfred Woodard is a 34 y.o. male, seen on rounds today.  The patient presented with a chief complaint of opoid detox and polysubstance abuse. Symptoms had been present for months prior to presentation for care, and were exacerbated by unknown.  Currently, the patient is sleeping.     Temp:  [97.5 F (36.4 C)-98.5 F (36.9 C)] 98.5 F (36.9 C) Pulse:  [87-93] 88 Resp:  [16-18] 18 BP: (132-151)/(74-82) 138/82 SpO2:  [96 %-100 %] 96 %  non labored breathing  Psychiatric examination: resting comfortably, not acutely agitated or combative.  MDM:  I have reviewed the labs performed to date as well as medications administered while in observation. Recent changes in the last 24 hours include none.  Current plan is for psychiatric evaluation this morning.  Patient is not under full IVC at this time.   4:00 PM Patient discharged by Psychiatry team to [facility] for outpatient opiate abuse treatment.  This document serves as a record of services personally performed by Alm Perry, MD. It was created on their behalf by Doss Mania, Medical Scribe, a trained medical scribe. The creation of this record is the provider's dictation and/or activities during the visit.   Electronically signed by: Doss Mania, Medical Scribe 07/08/2020 8:41 AM      Electronically signed by: Alm Ozell Perry, MD 07/08/20 1600

## 2023-01-13 ENCOUNTER — Emergency Department (HOSPITAL_COMMUNITY)
Admission: EM | Admit: 2023-01-13 | Discharge: 2023-01-13 | Disposition: A | Discharge status: Home / Self Care | Payer: Self-pay | Attending: Emergency Medicine | Admitting: Emergency Medicine

## 2023-01-13 ENCOUNTER — Emergency Department (HOSPITAL_COMMUNITY): Payer: Self-pay

## 2023-01-13 ENCOUNTER — Encounter (HOSPITAL_COMMUNITY): Payer: Self-pay | Admitting: Emergency Medicine

## 2023-01-13 DIAGNOSIS — Z23 Encounter for immunization: Secondary | ICD-10-CM | POA: Insufficient documentation

## 2023-01-13 DIAGNOSIS — J069 Acute upper respiratory infection, unspecified: Secondary | ICD-10-CM | POA: Insufficient documentation

## 2023-01-13 DIAGNOSIS — Z1152 Encounter for screening for COVID-19: Secondary | ICD-10-CM | POA: Insufficient documentation

## 2023-01-13 LAB — RESP PANEL BY RT-PCR (RSV, FLU A&B, COVID)  RVPGX2
Influenza A by PCR: NEGATIVE
Influenza B by PCR: NEGATIVE
Resp Syncytial Virus by PCR: NEGATIVE
SARS Coronavirus 2 by RT PCR: NEGATIVE

## 2023-01-13 MED ORDER — METHOCARBAMOL 500 MG PO TABS: 500.0000 mg | ORAL_TABLET | Freq: Two times a day (BID) | ORAL | 0 refills | Status: AC

## 2023-01-13 MED ORDER — TETANUS-DIPHTH-ACELL PERTUSSIS 5-2.5-18.5 LF-MCG/0.5 IM SUSY
0.5000 mL | PREFILLED_SYRINGE | Freq: Once | INTRAMUSCULAR | Status: AC
  Administered 2023-01-13: 0.5 mL via INTRAMUSCULAR
  Filled 2023-01-13: qty 0.5

## 2023-01-13 NOTE — ED Provider Notes (Signed)
[facility] Provider Note   CSN: [id number] Arrival date & time: 01/13/23  [id number]     History  No chief complaint on file.   Alfred Woodard is a 37 y.o. male history of hepatitis C otherwise healthy.  Patient reports that over the past 1 week he has been experiencing rhinorrhea, nasal congestion and nonproductive cough.  This has been a constant issue which has been gradually doing.  He reports of the past few days he has noticed some pain to the right side of his face and feels some of his jaw is somewhat tight and spasming.  He describes his pain as aching moderate constant no aggravating or alleviating factors.  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  He reports his last tetanus vaccine was in 2014.  Patient denies fever, chills, chest pain/shortness of breath, vomiting or any additional concerns.  HPI     Home Medications Prior to Admission medications   Medication Sig Start Date End Date Taking? Authorizing Provider  methocarbamol (ROBAXIN) 500 MG tablet Take 1 tablet (500 mg total) by mouth 2 (two) times daily for 5 days. 01/13/23 01/18/23 Yes Nuala Alpha A, PA-C  benzonatate (TESSALON) 100 MG capsule Take 1 capsule (100 mg total) by mouth every 8 (eight) hours. 12/29/18   Loura Halt A, NP  ibuprofen (ADVIL,MOTRIN) 800 MG tablet Take 1 tablet (800 mg total) by mouth 3 (three) times daily. 01/21/18   Zigmund Gottron, NP      Allergies    Cefaclor    Review of Systems   Review of Systems  Constitutional:  Negative for chills and fever.  HENT:  Positive for sinus pressure. Negative for sore throat and trouble swallowing.   Cardiovascular: Negative.  Negative for chest pain.  Gastrointestinal: Negative.  Negative for abdominal pain.    Physical Exam Updated Vital Signs BP 110/74 (BP Location: Left Arm)   Pulse (!) 107   Temp 98.8 F (37.1 C)   Resp 12    SpO2 97%  Physical Exam Constitutional:      General: He is not in acute distress.    Appearance: Normal appearance. He is well-developed. He is not ill-appearing or diaphoretic.  HENT:     Head: Normocephalic and atraumatic.     Jaw: There is normal jaw occlusion. No trismus.     Right Ear: Tympanic membrane and external ear normal.     Left Ear: Tympanic membrane and external ear normal.     Nose: Congestion and rhinorrhea present. Rhinorrhea is clear.     Right Sinus: Maxillary sinus tenderness present. No frontal sinus tenderness.     Left Sinus: Maxillary sinus tenderness present. No frontal sinus tenderness.     Mouth/Throat:     Mouth: Mucous membranes are moist.     Pharynx: Oropharynx is clear. Uvula midline.     Comments: Negative spatula test Eyes:     General: Vision grossly intact. Gaze aligned appropriately.     Extraocular Movements: Extraocular movements intact.     Conjunctiva/sclera: Conjunctivae normal.     Pupils: Pupils are equal, round, and reactive to light.  Neck:     Trachea: Trachea and phonation normal. No tracheal tenderness or tracheal deviation.     Meningeal: Brudzinski's sign absent.  Cardiovascular:     Rate and Rhythm: Normal rate and regular rhythm.     Heart  sounds: Normal heart sounds.  Pulmonary:     Effort: Pulmonary effort is normal. No accessory muscle usage or respiratory distress.     Breath sounds: Normal breath sounds and air entry.  Abdominal:     General: There is no distension.     Palpations: Abdomen is soft.     Tenderness: There is no abdominal tenderness. There is no guarding or rebound.  Musculoskeletal:        General: Normal range of motion.     Cervical back: Normal range of motion and neck supple.     Comments: Full spontaneous ROM of the cervical spine without pain.  No meningismus.  Intact and equal bilateral DTR with brachioradialis, biceps, Achilles, patella.  Negative Hoffmann's.  No clonus.  No asterixis.  Skin:     General: Skin is warm and dry.  Neurological:     Mental Status: He is alert.     GCS: GCS eye subscore is 4. GCS verbal subscore is 5. GCS motor subscore is 6.     Comments: Speech is clear and goal oriented, follows commands Major Cranial nerves without deficit, no facial droop Moves extremities without ataxia, coordination intact  Psychiatric:        Behavior: Behavior normal.    ED Results / Procedures / Treatments   Labs (all labs ordered are listed, but only abnormal results are displayed) Labs Reviewed  RESP PANEL BY RT-PCR (RSV, FLU A&B, COVID)  [id number]    EKG None  Radiology DG Chest 2 View  Result Date: 01/13/2023 CLINICAL DATA:  Chest pain, shortness of breath, congestion, and cough. EXAM: CHEST - 2 VIEW COMPARISON:  Chest radiographs 12/29/2018 FINDINGS: The cardiomediastinal silhouette is unchanged and within normal limits. The lungs are well inflated and clear. No pleural effusion or pneumothorax is identified. No acute osseous abnormality is seen. IMPRESSION: No active cardiopulmonary disease. Electronically Signed   By: Logan Bores M.D.   On: 01/13/2023 10:53    Procedures Procedures    Medications Ordered in ED Medications  Tdap (BOOSTRIX) injection 0.5 mL (0.5 mLs Intramuscular Given 01/13/23 1221)    ED Course/ Medical Decision Making/ A&P                             Medical Decision Making 25 male history hepatitis C presented for URI symptoms for the past 1 week.  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  He is concerned because he is having some soreness to the right side of his face and right jaw.  On exam he is well-appearing and in no acute distress.  Cranial nerves intact.  No meningeal signs.  Tympanic membranes clear.  Oropharynx clear.  Negative spatulate test.  Normal DTRs bilaterally.  No asterixis.  Cardiopulmonary exam is unremarkable.  Abdomen soft  nontender.  COVID/influenza/RSV test was negative in triage.  Patient had chest x-ray in triage which was also negative for acute findings.  I have a low suspicion for acute tetanus at this time we will update patient's tetanus vaccine today.  We can provide a muscle relaxer for patient feels her muscle spasms overall suspect this may be more of a viral infection at this point.  Have low suspicion for meningitis, PTA, RPA, Ludwig's, dental abscess or other emergent causes of symptoms at this time.  Encouraged close PCP  follow-up. Discussed plan w/ Dr. Sherry Ruffing who agrees with plan of care.  Amount and/or Complexity of Data Reviewed Radiology: ordered.    Details: I personally reviewed and interpreted patient's two-view chest x-ray.  I do not appreciate any obvious PTX, effusion, PNA or other acute cardiopulmonary process.  I agree with radiologist interpretation.  Risk Prescription drug management.    At this time there does not appear to be any evidence of an acute emergency medical condition and the patient appears stable for discharge with appropriate outpatient follow up. Diagnosis was discussed with patient who verbalizes understanding of care plan and is agreeable to discharge. I have discussed return precautions with patient and  who verbalizes understanding. Patient encouraged to follow-up with their PCP. All questions answered.  Patient's case discussed with Dr. Sherry Ruffing who agrees with plan to discharge with follow-up.   Note: Portions of this report may have been transcribed using voice recognition software. Every effort was made to ensure accuracy; however, inadvertent computerized transcription errors may still be present.         Final Clinical Impression(s) / ED Diagnoses Final diagnoses:  Viral upper respiratory tract infection    Rx / DC Orders ED Discharge Orders          Ordered    methocarbamol (ROBAXIN) 500 MG tablet  2 times daily        01/13/23 1232               Gari Crown 01/13/23 1232    Tegeler, Gwenyth Allegra, MD 01/13/23 1304

## 2023-01-13 NOTE — Discharge Instructions (Addendum)
At this time there does not appear to be the presence of an emergent medical condition, however there is always the potential for conditions to change. Please read and follow the below instructions.  Please return to the Emergency Department immediately for any new or worsening symptoms or if your symptoms do not improve within 3 days. Please be sure to follow up with your Primary Care Provider within one week regarding your visit today; please call their office to schedule an appointment even if you are feeling better for a follow-up visit. Please drink plenty of water and get plenty of rest.  You may try over-the-counter Motrin as directed on the packaging to help with your symptoms. You may use the muscle relaxer Robaxin as prescribed to help with your symptoms.  Do not drive or operate heavy machinery while taking Robaxin as it will make you drowsy.  Do not drink alcohol or take other sedating medications while taking Robaxin as this will worsen side effects.   Please read the additional information packets attached to your discharge summary.  Go to the nearest Emergency Department immediately if: You have fever or chills You have shortness of breath that gets worse. You have very bad or constant: Headache. Ear pain. Pain in your forehead, behind your eyes, and over your cheekbones (sinus pain). Chest pain. You have long-lasting (chronic) lung disease along with any of these: Making high-pitched whistling sounds when you breathe, most often when you breathe out (wheezing). Long-lasting cough (more than 14 days). Coughing up blood. A change in your usual mucus. You have a stiff neck. You have changes in your: Vision. Hearing. Thinking. Mood.      You have any new/concerning or worsening of symptoms  Do not take your medicine if  develop an itchy rash, swelling in your mouth or lips, or difficulty breathing; call 911 and seek immediate emergency medical attention if this  occurs.  You may review your lab tests and imaging results in their entirety on your MyChart account.  Please discuss all results of fully with your primary care provider and other specialist at your follow-up visit.  Note: Portions of this text may have been transcribed using voice recognition software. Every effort was made to ensure accuracy; however, inadvertent computerized transcription errors may still be present.

## 2023-01-13 NOTE — ED Triage Notes (Signed)
Pt here from home with c/o cough and congestion along with some sinus pressure to the right side of his face

## 2024-06-26 ENCOUNTER — Encounter (HOSPITAL_COMMUNITY): Payer: Self-pay | Admitting: Emergency Medicine

## 2024-06-26 ENCOUNTER — Other Ambulatory Visit: Payer: Self-pay

## 2024-06-26 ENCOUNTER — Inpatient Hospital Stay (HOSPITAL_COMMUNITY)
Admission: EM | Admit: 2024-06-26 | Discharge: 2024-06-30 | DRG: 872 | Disposition: A | Discharge status: Home / Self Care | Payer: MEDICAID | Attending: Internal Medicine | Admitting: Internal Medicine

## 2024-06-26 ENCOUNTER — Emergency Department (HOSPITAL_COMMUNITY): Payer: MEDICAID

## 2024-06-26 DIAGNOSIS — R6520 Severe sepsis without septic shock: Secondary | ICD-10-CM | POA: Diagnosis present

## 2024-06-26 DIAGNOSIS — Z22322 Carrier or suspected carrier of Methicillin resistant Staphylococcus aureus: Secondary | ICD-10-CM | POA: Diagnosis not present

## 2024-06-26 DIAGNOSIS — K610 Anal abscess: Secondary | ICD-10-CM | POA: Diagnosis present

## 2024-06-26 DIAGNOSIS — E876 Hypokalemia: Secondary | ICD-10-CM | POA: Diagnosis not present

## 2024-06-26 DIAGNOSIS — L0231 Cutaneous abscess of buttock: Secondary | ICD-10-CM | POA: Diagnosis present

## 2024-06-26 DIAGNOSIS — I868 Varicose veins of other specified sites: Secondary | ICD-10-CM | POA: Diagnosis present

## 2024-06-26 DIAGNOSIS — L02215 Cutaneous abscess of perineum: Principal | ICD-10-CM | POA: Diagnosis present

## 2024-06-26 DIAGNOSIS — F319 Bipolar disorder, unspecified: Secondary | ICD-10-CM | POA: Diagnosis present

## 2024-06-26 DIAGNOSIS — E860 Dehydration: Secondary | ICD-10-CM | POA: Diagnosis present

## 2024-06-26 DIAGNOSIS — F1990 Other psychoactive substance use, unspecified, uncomplicated: Secondary | ICD-10-CM | POA: Insufficient documentation

## 2024-06-26 DIAGNOSIS — R161 Splenomegaly, not elsewhere classified: Secondary | ICD-10-CM | POA: Diagnosis present

## 2024-06-26 DIAGNOSIS — F17200 Nicotine dependence, unspecified, uncomplicated: Secondary | ICD-10-CM | POA: Diagnosis present

## 2024-06-26 DIAGNOSIS — Z888 Allergy status to other drugs, medicaments and biological substances status: Secondary | ICD-10-CM

## 2024-06-26 DIAGNOSIS — Z818 Family history of other mental and behavioral disorders: Secondary | ICD-10-CM

## 2024-06-26 DIAGNOSIS — A419 Sepsis, unspecified organism: Principal | ICD-10-CM | POA: Diagnosis present

## 2024-06-26 DIAGNOSIS — R300 Dysuria: Secondary | ICD-10-CM | POA: Diagnosis present

## 2024-06-26 LAB — CBC WITH DIFFERENTIAL/PLATELET
Abs Immature Granulocytes: 0.06 K/uL (ref 0.00–0.07)
Basophils Absolute: 0.1 K/uL (ref 0.0–0.1)
Basophils Relative: 0 %
Eosinophils Absolute: 0.1 K/uL (ref 0.0–0.5)
Eosinophils Relative: 1 %
HCT: 41.2 % (ref 39.0–52.0)
Hemoglobin: 13.6 g/dL (ref 13.0–17.0)
Immature Granulocytes: 1 %
Lymphocytes Relative: 13 %
Lymphs Abs: 1.6 K/uL (ref 0.7–4.0)
MCH: 30.6 pg (ref 26.0–34.0)
MCHC: 33 g/dL (ref 30.0–36.0)
MCV: 92.6 fL (ref 80.0–100.0)
Monocytes Absolute: 1 K/uL (ref 0.1–1.0)
Monocytes Relative: 8 %
Neutro Abs: 9.8 K/uL — ABNORMAL HIGH (ref 1.7–7.7)
Neutrophils Relative %: 77 %
Platelets: 196 K/uL (ref 150–400)
RBC: 4.45 MIL/uL (ref 4.22–5.81)
RDW: 13.4 % (ref 11.5–15.5)
WBC: 12.7 K/uL — ABNORMAL HIGH (ref 4.0–10.5)
nRBC: 0 % (ref 0.0–0.2)

## 2024-06-26 LAB — I-STAT CG4 LACTIC ACID, ED
Lactic Acid, Venous: 3.1 mmol/L (ref 0.5–1.9)
Lactic Acid, Venous: 2.6 mmol/L (ref 0.5–1.9)

## 2024-06-26 LAB — BASIC METABOLIC PANEL WITH GFR
Anion gap: 12 (ref 5–15)
BUN: 14 mg/dL (ref 6–20)
CO2: 24 mmol/L (ref 22–32)
Calcium: 9.4 mg/dL (ref 8.9–10.3)
Chloride: 98 mmol/L (ref 98–111)
Creatinine, Ser: 0.9 mg/dL (ref 0.61–1.24)
GFR, Estimated: >60 mL/min (ref >60)
Glucose, Bld: 121 mg/dL — ABNORMAL HIGH (ref 70–99)
Potassium: 3.6 mmol/L (ref 3.5–5.1)
Sodium: 134 mmol/L — ABNORMAL LOW (ref 135–145)

## 2024-06-26 MED ORDER — PIPERACILLIN-TAZOBACTAM 3.375 G IVPB
3.3750 g | Freq: Three times a day (TID) | INTRAVENOUS | Status: DC
  Administered 2024-06-27 – 2024-06-30 (×10): 3.375 g via INTRAVENOUS
  Filled 2024-06-26 (×11): qty 50

## 2024-06-26 MED ORDER — LIDOCAINE-EPINEPHRINE (PF) 2 %-1:200000 IJ SOLN
20.0000 mL | Freq: Once | INTRAMUSCULAR | Status: AC
  Administered 2024-06-26: 20 mL
  Filled 2024-06-26: qty 20

## 2024-06-26 MED ORDER — SODIUM CHLORIDE 0.9 % IV SOLN: 250.0000 mL | INTRAVENOUS | Status: AC | PRN

## 2024-06-26 MED ORDER — HYDROMORPHONE HCL 1 MG/ML IJ SOLN
1.0000 mg | Freq: Once | INTRAMUSCULAR | Status: AC
  Administered 2024-06-26: 1 mg via INTRAVENOUS
  Filled 2024-06-26: qty 1

## 2024-06-26 MED ORDER — SODIUM CHLORIDE 0.9% FLUSH
3.0000 mL | Freq: Two times a day (BID) | INTRAVENOUS | Status: DC
  Administered 2024-06-26 – 2024-06-29 (×5): 3 mL via INTRAVENOUS

## 2024-06-26 MED ORDER — LACTATED RINGERS IV BOLUS
1000.0000 mL | Freq: Once | INTRAVENOUS | Status: AC
  Administered 2024-06-26: 1000 mL via INTRAVENOUS

## 2024-06-26 MED ORDER — CLINDAMYCIN PHOSPHATE 600 MG/50ML IV SOLN
600.0000 mg | Freq: Once | INTRAVENOUS | Status: AC
  Administered 2024-06-26: 600 mg via INTRAVENOUS
  Filled 2024-06-26: qty 50

## 2024-06-26 MED ORDER — SODIUM CHLORIDE 0.9% FLUSH: 3.0000 mL | INTRAVENOUS | Status: DC | PRN

## 2024-06-26 MED ORDER — VANCOMYCIN HCL 1250 MG/250ML IV SOLN
1250.0000 mg | Freq: Two times a day (BID) | INTRAVENOUS | Status: DC
  Administered 2024-06-27 – 2024-06-29 (×6): 1250 mg via INTRAVENOUS
  Filled 2024-06-26 (×7): qty 250

## 2024-06-26 MED ORDER — VANCOMYCIN HCL 1500 MG/300ML IV SOLN
1500.0000 mg | Freq: Once | INTRAVENOUS | Status: AC
  Administered 2024-06-26: 1500 mg via INTRAVENOUS
  Filled 2024-06-26: qty 300

## 2024-06-26 MED ORDER — LACTATED RINGERS IV SOLN: INTRAVENOUS | Status: AC

## 2024-06-26 MED ORDER — SENNOSIDES-DOCUSATE SODIUM 8.6-50 MG PO TABS
1.0000 | ORAL_TABLET | Freq: Two times a day (BID) | ORAL | Status: DC
  Administered 2024-06-27 – 2024-06-29 (×5): 1 via ORAL
  Filled 2024-06-26 (×5): qty 1

## 2024-06-26 MED ORDER — HYDROMORPHONE HCL 1 MG/ML IJ SOLN
0.5000 mg | INTRAMUSCULAR | Status: DC | PRN
  Administered 2024-06-26 – 2024-06-30 (×19): 1 mg via INTRAVENOUS
  Filled 2024-06-26 (×21): qty 1

## 2024-06-26 MED ORDER — ONDANSETRON HCL 4 MG PO TABS: 4.0000 mg | ORAL_TABLET | Freq: Four times a day (QID) | ORAL | Status: DC | PRN

## 2024-06-26 MED ORDER — SODIUM CHLORIDE 0.9 % IV BOLUS
1000.0000 mL | Freq: Once | INTRAVENOUS | Status: AC
  Administered 2024-06-26: 1000 mL via INTRAVENOUS

## 2024-06-26 MED ORDER — ACETAMINOPHEN 650 MG RE SUPP: 650.0000 mg | Freq: Four times a day (QID) | RECTAL | Status: DC | PRN

## 2024-06-26 MED ORDER — SODIUM CHLORIDE 0.9% FLUSH
3.0000 mL | Freq: Two times a day (BID) | INTRAVENOUS | Status: DC
  Administered 2024-06-26 – 2024-06-29 (×5): 3 mL via INTRAVENOUS

## 2024-06-26 MED ORDER — IOHEXOL 300 MG/ML  SOLN
100.0000 mL | Freq: Once | INTRAMUSCULAR | Status: AC | PRN
  Administered 2024-06-26: 100 mL via INTRAVENOUS

## 2024-06-26 MED ORDER — PIPERACILLIN-TAZOBACTAM 3.375 G IVPB 30 MIN
3.3750 g | Freq: Once | INTRAVENOUS | Status: AC
  Administered 2024-06-26: 3.375 g via INTRAVENOUS
  Filled 2024-06-26: qty 50

## 2024-06-26 MED ORDER — ONDANSETRON HCL 4 MG/2ML IJ SOLN: 4.0000 mg | Freq: Four times a day (QID) | INTRAMUSCULAR | Status: DC | PRN

## 2024-06-26 MED ORDER — ACETAMINOPHEN 325 MG PO TABS
650.0000 mg | ORAL_TABLET | Freq: Four times a day (QID) | ORAL | Status: DC | PRN
  Administered 2024-06-27: 650 mg via ORAL
  Filled 2024-06-26: qty 2

## 2024-06-26 NOTE — Consult Note (Signed)
 Reason for Consult:  Perineal abscess Referring Provider: Pamella, DO  HPI  Alfred Woodard is an 38 y.o. male with history of IV drug use, polysubstance abuse, and bipolar disorder presented to the ED earlier today with worsening left buttock abscess.  Patient states that the abscess began as an ingrown hair that worsened to a boil about 4 days ago.  Boil did rupture spontaneously 2 days ago.  At that time is when his fevers began and pain really began.  Pain is progressively become worse making it difficult to urinate and defecate.  Feels that he has been constipated.  Patient states that he has had shortness of breath, chills, dizziness, vision changes, nausea, waves of pain down his left leg, dysuria, painful defecation, and swollen glands near his scrotum.  No nausea or emesis.  CT scan showed 7.8 cm loculated abscess in the inferior perineum with surrounding inflammatory stranding and skin thickening.  Reactive left inguinal lymph nodes.  Labs notable for leukocytosis to 12.7.  His lactic acid was elevated at 3.1.  I suspect since he has had poor p.o. intake over the last few days this is in part dehydration in addition to active infection.  His lactate downtrended to 2.6.  He received 2 L of fluid in the emergency department.  10 point review of systems is negative except as listed above in HPI.  Objective  Past Medical History: Past Medical History:  Diagnosis Date   Acute bronchitis    Bipolar disorder, unspecified (HCC)    Chicken pox    Chronic back pain    Depression    suicide attempt in 2004, psychiatric admis   Erectile dysfunction    Polysubstance abuse (HCC)    Syncope and collapse    Tobacco use disorder     Past Surgical History: Past Surgical History:  Procedure Laterality Date   WISDOM TOOTH EXTRACTION      Family History:  Family History  Problem Relation Age of Onset   Anxiety disorder Mother        Living   Bipolar disorder Father 6        Deceased   Schizophrenia Father    ADD / ADHD Brother        x2    Social History:  reports that he has been smoking. He has a 7.5 pack-year smoking history. He has never used smokeless tobacco. He reports current alcohol use of about 5.0 standard drinks of alcohol per week. He reports that he does not use drugs.  Allergies:  Allergies  Allergen Reactions   Cefaclor Other (See Comments)    childhood    Medications: I have reviewed the patient's current medications.  Labs: I have personally reviewed all labs for the past 24h  Imaging: I have personally reviewed and interpreted all imaging for the past 24h and agree with the radiologist's impression.  CT ABDOMEN PELVIS W CONTRAST Result Date: 06/26/2024 CLINICAL DATA:  Evaluate for buttocks or perineal abscess. EXAM: CT ABDOMEN AND PELVIS WITH CONTRAST TECHNIQUE: Multidetector CT imaging of the abdomen and pelvis was performed using the standard protocol following bolus administration of intravenous contrast. RADIATION DOSE REDUCTION: This exam was performed according to the departmental dose-optimization program which includes automated exposure control, adjustment of the mA and/or kV according to patient size and/or use of iterative reconstruction technique. CONTRAST:  100mL OMNIPAQUE IOHEXOL 300 MG/ML  SOLN COMPARISON:  None Available. FINDINGS: Lower chest: No acute abnormality. Hepatobiliary: No focal liver abnormality is  seen. No gallstones, gallbladder wall thickening, or biliary dilatation. Pancreas: Unremarkable. No pancreatic ductal dilatation or surrounding inflammatory changes. Spleen: Mildly enlarged. Adrenals/Urinary Tract: Adrenal glands are unremarkable. Kidneys are normal, without renal calculi, focal lesion, or hydronephrosis. Bladder is unremarkable. Stomach/Bowel: Stomach is within normal limits. Appendix appears normal. No evidence of bowel wall thickening, distention, or inflammatory changes. 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 Aorta and IVC are normal in size. No enlarged lymph nodes are identified. There some prominent left inguinal lymph nodes. Reproductive: Prostate is unremarkable. Other: Enhancing multiloculated fluid collection is seen in the inferior left perineum with surrounding inflammatory stranding measuring 7.8 x 3.3 x 3.8 cm. There is associated skin thickening. There is no ascites. There is no free air or focal abdominal wall hernia. Musculoskeletal: No fracture is seen. IMPRESSION: 1. 7.8 cm multiloculated abscess in the inferior left perineum with surrounding inflammatory stranding and skin thickening. 2. Prominent left inguinal lymph nodes, likely reactive. 3. Mild splenomegaly with splenic varices.  Is Electronically Signed   By: Greig Pique M.D.   On: 06/26/2024 22:06     Physical Exam Blood pressure 99/62, pulse (!) 107, temperature (!) 100.4 F (38 C), temperature source Oral, resp. rate 16, height 6' (1.829 m), weight 70.3 kg, SpO2 98%. General: no acute distress HEENT: normocephalic, atraumatic Oropharynx: mucous membranes dry CV: Sinus tachycardia, normotensive Chest: equal chest rise bilaterally normal respiratory effort on room air Rectal: Indurated, erythematous, exquisitely tender and swollen area to the left perineum. Extremities: moves all extremities Skin: warm, dry, no rashes Psych: normal memory, normal mood/affect  Neuro: No focal neurologic deficits, A&Ox3    Assessment   Alfred Woodard is an 38 y.o. male with perineal abscess  Plan  - Status post incision and drainage in the emergency department, please see procedure note for details -Recommend medicine admission for IV antibiotics -IV fluids per TRH -Aerobic/anaerobic cultures collected with incision and drainage - FEN - NPO except sips/chips, will reassess in the morning to determine if he needs further incision and drainage in the operating room with an exam under anesthesia.  Was not able to tolerate  a more extensive procedure at bedside - DVT - SCDs, okay for DVT prophylaxis from a surgical perspective  I reviewed nursing notes, ED provider notes, last 24 h vitals and pain scores, last 48 h intake and output, last 24 h labs and trends, and last 24 h imaging results.  I discussed plan of care directly with Dr. Pamella.  This care required moderate level of medical decision making.    Orie Silversmith, MD [facility]

## 2024-06-26 NOTE — Procedures (Signed)
 Incision and Drainage  Date/Time: 06/26/2024 11:35 PM  Performed by: Ann Fine, MD Authorized by: Ann Fine, MD   Consent:    Consent obtained:  Verbal   Consent given by:  Patient   Risks, benefits, and alternatives were discussed: yes     Risks discussed:  Bleeding, incomplete drainage, infection and pain   Alternatives discussed:  No treatment Universal protocol:    Procedure explained and questions answered to patient or proxy's satisfaction: yes     Test results available : yes     Imaging studies available: yes     Immediately prior to procedure, a time out was called: yes     Patient identity confirmed:  Verbally with patient Location:    Type:  Abscess   Size:  8 x 3 cm   Location:  Anogenital Pre-procedure details:    Skin preparation:  Povidone-iodine Sedation:    Sedation type:  None Anesthesia:    Anesthesia method:  Local infiltration   Local anesthetic:  Lidocaine 1% WITH epi Procedure type:    Complexity:  Complex Procedure details:    Ultrasound guidance: no     Needle aspiration: no     Incision types:  Cruciate   Incision depth:  Subcutaneous   Wound management:  Probed and deloculated, irrigated with saline and extensive cleaning   Drainage:  Bloody and purulent   Drainage amount:  Copious   Wound treatment:  Wound left open   Packing material: gauze. Post-procedure details:    Procedure completion:  Tolerated with difficulty Comments:     Patient was able to tolerate procedure however there was quite a bit of pain with the procedure, limiting extent of probing, and breaking up loculations with hemostat.

## 2024-06-26 NOTE — ED Triage Notes (Signed)
  Patient comes in with abscess to L buttock that has been going on for several days.  Patient states he attempted to drain it at home and get a lot of pus out but now its spread and gotten bigger.  Possible fevers at home.  Afebrile here.  Took 650 mg tylenol  earlier this morning.  Pain 10/10, sharp/throbbing.

## 2024-06-26 NOTE — Sepsis Progress Note (Addendum)
 Elink monitoring for the code sepsis protocol.  Labs collected & antibiotics given prior to code sepsis order.

## 2024-06-26 NOTE — Progress Notes (Signed)
 ED Pharmacy Antibiotic Sign Off An antibiotic consult was received from an ED provider for vancomycin, piperacillin/tazobactam per pharmacy dosing for sepsis. A chart review was completed to assess appropriateness.   Noted cefaclor childhood allergy in chart. Review of EPIC shows a prescription for Augmentin  in 2017. D/w MD - will proceed with a dose of piperacillin/tazobactam.   The following one time order(s) were placed:  Vancomycin 1500 mg IV once, piperacillin/tazobactam 3.375 g IV once over 30 mins  Further antibiotic and/or antibiotic pharmacy consults should be ordered by the admitting provider if indicated.   Ronal CHRISTELLA Rav, [facility]  Clinical Pharmacist 06/26/24 8:38 PM

## 2024-06-26 NOTE — Progress Notes (Signed)
 Pharmacy Antibiotic Note  Alfred Woodard is a 38 y.o. male admitted on 06/26/2024 with abscess to L buttock .  Pharmacy has been consulted for vancomycin dosing, 1st dose given in the ED.  Plan: Vancomycin 1250mg  IV q12h (AUC 499.2, Scr 0.9, TBW) Follow renal function, cultures and clinical course  Height: 6' (182.9 cm) Weight: 70.3 kg (155 lb) IBW/kg (Calculated) : 77.6  Temp (24hrs), Avg:99.6 F (37.6 C), Min:98.8 F (37.1 C), Max:100.4 F (38 C)  Recent Labs  Lab 06/26/24 2103 06/26/24 2113 06/26/24 2243  WBC 12.7*  --   --   CREATININE 0.90  --   --   LATICACIDVEN  --  3.1* 2.6*    Estimated Creatinine Clearance: 113.9 mL/min (by C-G formula based on SCr of 0.9 mg/dL).    Allergies  Allergen Reactions   Cefaclor Other (See Comments)    childhood    Antimicrobials this admission: 7/25 vanc >> 7/25 zosyn >> 7/25 clinda x1  Dose adjustments this admission:   Microbiology results: 7/25 BCx:  7/25 UCx:   Thank you for allowing pharmacy to be a part of this patient's care.  Leeroy Mace RPh 06/26/2024, 10:56 PM

## 2024-06-26 NOTE — H&P (Addendum)
 History and Physical    Alfred Woodard FMW:[id number] DOB: 1986/07/18 DOA: 06/26/2024  PCP: Patient, No Pcp Per   Patient coming from: Home   Chief Complaint:  Chief Complaint  Patient presents with   Abscess   ED TRIAGE note:    Patient comes in with abscess to L buttock that has been going on for several days.  Patient states he attempted to drain it at home and get a lot of pus out but now its spread and gotten bigger.  Possible fevers at home.  Afebrile here.  Took 650 mg tylenol  earlier this morning.  Pain 10/10, sharp/throbbing.       HPI:  Alfred Woodard is a 38 y.o. male with medical history significant of ADD, bipolar disorder, depression, chronic back pain, IV drug use, history of polysubstance use and erectile dysfunction presented emergency department complaining of worsening left-sided buttock abscess. States initially the abscess began as an ingrown hair that progressively worsened to a boil about 4 days ago. The boil ruptured 2 days ago after the patient attempted to drain it, and he states that his pain and his fevers began around then. His infection is spreading inward towards his perineum and left testicle. This is making it painful to defecate or urinate, and has not eaten much the last few days to reduce the need to use the restroom. His pain is a 10/10 and is sharp/throbbing in nature. States he has had fevers 99-100 degrees at home and at [facility] earlier today, although he is afebrile here. Has had SOB, chills, nausea and vomiting.   Patient reported that waves of pain down his left leg, dysuria, painful defecation, and swollen glands near the scrotum.   ED Course:  At presentation to ED patient found borderline hypotensive tach, tachycardic and febrile. BMP showed low sodium 134 otherwise unremarkable. CBC showed leukocytosis 12.7 otherwise unremarkable.  Blood cultures are in process. Lactic acid level 3.1.  CT abdomen pelvis showed 7.8 cm multiloculated abscess  in the inferior left perineum with surrounding inflammatory stranding and skin thickening.  Reactive lymph node.  Mild splenomegaly with splenic varices.  In the ED patient has been empirically treated with clindamycin , Zosyn  and vancomycin .  Patient also received 1 L of NS bolus.  CT abdomen pelvis ruled out Fournier's gangrene.  Dr. Purvis has been consulted and discussed case with general surgery Dr. Ann with  will come assess patient and try to drain abscess at bedside. Will prep I&D kit and lidocaine  for drainage procedure.  Per general surgery if I&D at bedside is not successful possibly will take to OR in the a.m.  Hospitalist has been consulted for further evaluation management of sepsis secondary to perineal abscess.    Significant labs in the ED: Lab Orders         Culture, blood (routine x 2)         Urine Culture (for pregnant, neutropenic or urologic patients or patients with an indwelling urinary catheter)         Body fluid culture w Gram Stain         Aerobic/Anaerobic Culture w Gram Stain (surgical/deep wound)         Basic metabolic panel         CBC with Differential         HIV Antibody (routine testing w rflx)         CBC         Comprehensive metabolic panel  Rapid urine drug screen (hospital performed)         Ethanol         Urinalysis, Routine w reflex microscopic -Urine, Clean Catch         Hepatitis panel, acute         I-Stat Lactic Acid         I-Stat CG4 Lactic Acid       Review of Systems:  Review of Systems  Constitutional:  Positive for chills and fever. Negative for malaise/fatigue and weight loss.  Respiratory:  Negative for cough, sputum production and shortness of breath.   Cardiovascular:  Negative for chest pain and palpitations.  Gastrointestinal:  Negative for blood in stool, heartburn, melena, nausea and vomiting.       Rectal pain and pain with defecation  Genitourinary:  Positive for dysuria. Negative for flank pain,  frequency, hematuria and urgency.  Neurological:  Negative for dizziness and headaches.  Psychiatric/Behavioral:  The patient is not nervous/anxious.     Past Medical History:  Diagnosis Date   Acute bronchitis    Bipolar disorder, unspecified (HCC)    Chicken pox    Chronic back pain    Depression    suicide attempt in 2004, psychiatric admis   Erectile dysfunction    Polysubstance abuse (HCC)    Syncope and collapse    Tobacco use disorder     Past Surgical History:  Procedure Laterality Date   WISDOM TOOTH EXTRACTION       reports that he has been smoking. He has a 7.5 pack-year smoking history. He has never used smokeless tobacco. He reports current alcohol use of about 5.0 standard drinks of alcohol per week. He reports that he does not use drugs.  Allergies  Allergen Reactions   Cefaclor Other (See Comments)    childhood    Family History  Problem Relation Age of Onset   Anxiety disorder Mother        Living   Bipolar disorder Father 79       Deceased   Schizophrenia Father    ADD / ADHD Brother        x2    Prior to Admission medications   Medication Sig Start Date End Date Taking? Authorizing Provider  acetaminophen  (TYLENOL ) 500 MG tablet Take 500 mg by mouth every 6 (six) hours as needed.   Yes [provider]  benzonatate  (TESSALON ) 100 MG capsule Take 1 capsule (100 mg total) by mouth every 8 (eight) hours. Patient not taking: Reported on 06/26/2024 12/29/18   Adah Corning A, FNP  ibuprofen  (ADVIL ,MOTRIN ) 800 MG tablet Take 1 tablet (800 mg total) by mouth 3 (three) times daily. Patient not taking: Reported on 06/26/2024 01/21/18   Tellis Laneta NOVAK, NP     Physical Exam: Vitals:   06/26/24 1949 06/26/24 2117  BP: (!) 140/99 99/62  Pulse: (!) 128 (!) 107  Resp: 18 16  Temp: 98.8 F (37.1 C) (!) 100.4 F (38 C)  TempSrc: Oral Oral  SpO2: 97% 98%  Weight: 70.3 kg   Height: 6' (1.829 m)     Physical Exam Vitals and nursing note  reviewed.  Constitutional:      Appearance: He is ill-appearing. He is not toxic-appearing.  HENT:     Mouth/Throat:     Mouth: Mucous membranes are dry.  Eyes:     Pupils: Pupils are equal, round, and reactive to light.  Cardiovascular:     Rate and Rhythm: Regular rhythm.  Tachycardia present.     Pulses: Normal pulses.     Heart sounds: Normal heart sounds.  Pulmonary:     Effort: Pulmonary effort is normal.     Breath sounds: Normal breath sounds.  Abdominal:     Palpations: Abdomen is soft.  Musculoskeletal:     Cervical back: Normal range of motion and neck supple.  Skin:    General: Skin is dry.     Capillary Refill: Capillary refill takes less than 2 seconds.  Neurological:     Mental Status: He is alert and oriented to person, place, and time.  Psychiatric:        Mood and Affect: Mood normal.        Thought Content: Thought content normal.      Labs on Admission: I have personally reviewed following labs and imaging studies  CBC: Recent Labs  Lab 06/26/24 2103  WBC 12.7*  NEUTROABS 9.8*  HGB 13.6  HCT 41.2  MCV 92.6  PLT 196   Basic Metabolic Panel: Recent Labs  Lab 06/26/24 2103  NA 134*  K 3.6  CL 98  CO2 24  GLUCOSE 121*  BUN 14  CREATININE 0.90  CALCIUM 9.4   GFR: Estimated Creatinine Clearance: 113.9 mL/min (by C-G formula based on SCr of 0.9 mg/dL). Liver Function Tests: No results for input(s): AST, ALT, ALKPHOS, BILITOT, PROT, ALBUMIN in the last 168 hours. No results for input(s): LIPASE, AMYLASE in the last 168 hours. No results for input(s): AMMONIA in the last 168 hours. Coagulation Profile: No results for input(s): INR, PROTIME in the last 168 hours. Cardiac Enzymes: No results for input(s): CKTOTAL, CKMB, CKMBINDEX, TROPONINI, TROPONINIHS in the last 168 hours. BNP (last 3 results) No results for input(s): BNP in the last 8760 hours. HbA1C: No results for input(s): HGBA1C in the last 72  hours. CBG: No results for input(s): GLUCAP in the last 168 hours. Lipid Profile: No results for input(s): CHOL, HDL, LDLCALC, TRIG, CHOLHDL, LDLDIRECT in the last 72 hours. Thyroid Function Tests: No results for input(s): TSH, T4TOTAL, FREET4, T3FREE, THYROIDAB in the last 72 hours. Anemia Panel: No results for input(s): VITAMINB12, FOLATE, FERRITIN, TIBC, IRON, RETICCTPCT in the last 72 hours. Urine analysis:    Component Value Date/Time   COLORURINE YELLOW 04/23/2014 1117   APPEARANCEUR CLEAR 04/23/2014 1117   LABSPEC 1.012 04/23/2014 1117   PHURINE 5.5 04/23/2014 1117   GLUCOSEU NEG 04/23/2014 1117   HGBUR NEG 04/23/2014 1117   BILIRUBINUR NEG 04/23/2014 1117   KETONESUR NEG 04/23/2014 1117   PROTEINUR NEG 04/23/2014 1117   UROBILINOGEN 0.2 04/23/2014 1117   NITRITE NEG 04/23/2014 1117   LEUKOCYTESUR TRACE (A) 04/23/2014 1117    Radiological Exams on Admission: I have personally reviewed images CT ABDOMEN PELVIS W CONTRAST Result Date: 06/26/2024 CLINICAL DATA:  Evaluate for buttocks or perineal abscess. EXAM: CT ABDOMEN AND PELVIS WITH CONTRAST TECHNIQUE: Multidetector CT imaging of the abdomen and pelvis was performed using the standard protocol following bolus administration of intravenous contrast. RADIATION DOSE REDUCTION: This exam was performed according to the departmental dose-optimization program which includes automated exposure control, adjustment of the mA and/or kV according to patient size and/or use of iterative reconstruction technique. CONTRAST:  OMNIPAQUE  IOHEXOL  300 MG/ML  SOLN COMPARISON:  None Available. FINDINGS: Lower chest: No acute abnormality. Hepatobiliary: No focal liver abnormality is seen. No gallstones, gallbladder wall thickening, or biliary dilatation. Pancreas: Unremarkable. No pancreatic ductal dilatation or surrounding inflammatory changes. Spleen: Mildly enlarged. Adrenals/Urinary  Tract: Adrenal glands are  unremarkable. Kidneys are normal, without renal calculi, focal lesion, or hydronephrosis. Bladder is unremarkable. Stomach/Bowel: Stomach is within normal limits. Appendix appears normal. No evidence of bowel wall thickening, distention, or inflammatory changes. 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 Aorta and IVC are normal in size. No enlarged lymph nodes are identified. There some prominent left inguinal lymph nodes. Reproductive: Prostate is unremarkable. Other: Enhancing multiloculated fluid collection is seen in the inferior left perineum with surrounding inflammatory stranding measuring 7.8 x 3.3 x 3.8 cm. There is associated skin thickening. There is no ascites. There is no free air or focal abdominal wall hernia. Musculoskeletal: No fracture is seen. IMPRESSION: 1. 7.8 cm multiloculated abscess in the inferior left perineum with surrounding inflammatory stranding and skin thickening. 2. Prominent left inguinal lymph nodes, likely reactive. 3. Mild splenomegaly with splenic varices.  Is Electronically Signed   By: Greig Pique M.D.   On: 06/26/2024 22:06      Assessment/Plan: Principal Problem:   Perineal abscess Active Problems:   Sepsis (HCC)   History of IVDU (intravenous drug user)    Assessment and Plan: Perineal abscess Sepsis secondary to abscess -Presented emergency department complaining of worsening left buttock abscess, low-grade fever at home, chills, dysuria, painful defecation and erection.  Patient denies any nausea vomiting and abdominal pain.  Patient reported that initially he has then boil 4 days ago and about 2 days ago patient attempted to drain it however reported unsuccessful attempt. -Presentation to ED patient has low-grade fever, tachycardic and hypotensive.  Physical exam showed around 8 cm perianal abscess. - CT abdomen pelvis showed 7.8 cm multiloculated abscess in the inferior left perineum with surrounding inflammatory stranding and skin  thickening. 2. Prominent left inguinal lymph nodes, likely reactive. 3. Mild splenomegaly with splenic varices. -Patient meets sepsis criteria in the setting of perianal abscess.  Fournier's gangrene has been ruled out based on CT imaging finding. -CBC showing leukocytosis otherwise unremarkable.  BMP showed slightly low sodium 134 otherwise unremarkable. - In the ED patient has been received 1 L of NS bolus, IV vancomycin , Zosyn  and clindamycin . - General Surgery Dr. Marland has been consulted plan for bedside I&D tonight if remains unsuccessful will take to OR in the a.m. - Continue resuscitate with IV fluid per sepsis protocol. - Continue IV vancomycin  and Zosyn . - Pending blood culture, UA and urine culture result. Update, - Dr. Marland performed bedside I&D of the perineal abscess.  Anaerobic and aerobic culture has been collected.  Recommended IV antibiotic and keep patient n.p.o. in case patient needs further I&D in the morning at OR under anesthesia.   History of IV drug use and polysubstance abuse -Patient has history of IV drug use.  Checking HIV, hepatitis panel.  Checking UDS and alcohol level.     DVT prophylaxis:  SCDs Code Status:  Full Code Diet:  Family Communication:   Family was present at bedside, at the time of interview.  Opportunity was given to ask question and all questions were answered satisfactorily.  Disposition Plan: Pending I&D of the perineal abscess at bedside. Consults: General Surgery Admission status:   Inpatient, Telemetry bed  Severity of Illness: The appropriate patient status for this patient is INPATIENT. Inpatient status is judged to be reasonable and necessary in order to provide the required intensity of service to ensure the patient's safety. The patient's presenting symptoms, physical exam findings, and initial radiographic and laboratory data in the context of their chronic comorbidities is felt to  place them at high risk for further clinical  deterioration. Furthermore, it is not anticipated that the patient will be medically stable for discharge from the hospital within 2 midnights of admission.   * I certify that at the point of admission it is my clinical judgment that the patient will require inpatient hospital care spanning beyond 2 midnights from the point of admission due to high intensity of service, high risk for further deterioration and high frequency of surveillance required.DEWAINE    Yaretzy Olazabal, MD [facility]  How to contact the [facility] Attending or Consulting provider 7A - 7P or covering provider during after hours 7P -7A, for this patient.  Check the care team in [facility] and look for a) attending/consulting [facility] provider listed and b) the [facility] team listed Log into [email or web address] and use [facility]'s universal password to access. If you do not have the password, please contact the hospital operator. Locate the [facility] provider you are looking for under [facility] and page to a number that you can be directly reached. If you still have difficulty reaching the provider, please page the [facility] (Director on Call) for the Hospitalists listed on amion for assistance.  06/27/2024, 12:59 AM

## 2024-06-26 NOTE — ED Provider Notes (Signed)
 [facility] AT [facility] Provider Note   CSN: [id number] Arrival date & time: 06/26/24  1933    Patient presents with: Abscess   Alfred Woodard is a 38 y.o. male with a past medical history of IVDU, polysubstance abuse, and bipolar disorder who presents to ED with a worsening left buttock abscess. States initially the abscess began as an ingrown hair that progressively worsened to a boil about 4 days ago. The boil ruptured 2 days ago after the patient attempted to drain it, and he states that his pain and his fevers began around then. His infection is spreading inward towards his perineum and left testicle. This is making it painful to defecate or urinate, and has not eaten much the last few days to reduce the need to use the restroom. His pain is a 10/10 and is sharp/throbbing in nature. States he has had fevers 99-100 degrees at home and at [facility] earlier today, although he is afebrile here. Has had SOB, chills, dizziness, vision changes, nausea, waves of pain down his left leg, dysuria, painful defecation, and swollen glands near the scrotum. Denies vomiting or chest pain.      Prior to Admission medications   Medication Sig Start Date End Date Taking? Authorizing Provider  acetaminophen  (TYLENOL ) 500 MG tablet Take 500 mg by mouth every 6 (six) hours as needed.   Yes [provider]  benzonatate  (TESSALON ) 100 MG capsule Take 1 capsule (100 mg total) by mouth every 8 (eight) hours. Patient not taking: Reported on 06/26/2024 12/29/18   Adah Corning A, FNP  ibuprofen  (ADVIL ,MOTRIN ) 800 MG tablet Take 1 tablet (800 mg total) by mouth 3 (three) times daily. Patient not taking: Reported on 06/26/2024 01/21/18   Tellis Edelman B, NP    Allergies: Cefaclor    Review of Systems  Constitutional:  Positive for appetite change, chills and fever.  Respiratory:  Positive for shortness of breath. Negative for cough and chest tightness.   Cardiovascular:  Positive  for palpitations. Negative for chest pain.  Gastrointestinal:  Positive for nausea. Negative for constipation, diarrhea and vomiting.  Genitourinary:  Positive for dysuria, penile pain, scrotal swelling and testicular pain.  Neurological:  Positive for dizziness, weakness and headaches.    Updated Vital Signs BP 99/62 (BP Location: Left Arm)   Pulse (!) 107   Temp (!) 100.4 F (38 C) (Oral)   Resp 16   Ht 6' (1.829 m)   Wt 70.3 kg   SpO2 98%   BMI 21.02 kg/m   Physical Exam Constitutional:      Appearance: He is ill-appearing.  HENT:     Head: Normocephalic.  Eyes:     Extraocular Movements: Extraocular movements intact.     Pupils: Pupils are equal, round, and reactive to light.  Cardiovascular:     Rate and Rhythm: Regular rhythm. Tachycardia present.     Pulses: Normal pulses.     Heart sounds: Normal heart sounds.  Pulmonary:     Effort: Pulmonary effort is normal.     Breath sounds: Normal breath sounds. No wheezing.  Genitourinary:    Comments: Indurated, erythematous, swollen area around the left-sided perineum, extending into the scrotum. Skin:    General: Skin is warm and dry.  Neurological:     General: No focal deficit present.     Mental Status: He is alert and oriented to person, place, and time.  Psychiatric:        Mood and Affect: Mood  normal.     (all labs ordered are listed, but only abnormal results are displayed) Labs Reviewed  BASIC METABOLIC PANEL WITH GFR - Abnormal; Notable for the following components:      Result Value   Sodium 134 (*)    Glucose, Bld 121 (*)    All other components within normal limits  CBC WITH DIFFERENTIAL/PLATELET - Abnormal; Notable for the following components:   WBC 12.7 (*)    Neutro Abs 9.8 (*)    All other components within normal limits  I-STAT CG4 LACTIC ACID, ED - Abnormal; Notable for the following components:   Lactic Acid, Venous 3.1 (*)    All other components within normal limits  I-STAT CG4 LACTIC  ACID, ED - Abnormal; Notable for the following components:   Lactic Acid, Venous 2.6 (*)    All other components within normal limits  CULTURE, BLOOD (ROUTINE X 2)  CULTURE, BLOOD (ROUTINE X 2)  URINE CULTURE  BODY FLUID CULTURE W GRAM STAIN  HIV ANTIBODY (ROUTINE TESTING W REFLEX)  CBC  COMPREHENSIVE METABOLIC PANEL WITH GFR  RAPID URINE DRUG SCREEN, HOSP PERFORMED  ETHANOL  URINALYSIS, ROUTINE W REFLEX MICROSCOPIC  HEPATITIS PANEL, ACUTE  I-STAT CG4 LACTIC ACID, ED    EKG: None  Radiology: CT ABDOMEN PELVIS W CONTRAST Result Date: 06/26/2024 CLINICAL DATA:  Evaluate for buttocks or perineal abscess. EXAM: CT ABDOMEN AND PELVIS WITH CONTRAST TECHNIQUE: Multidetector CT imaging of the abdomen and pelvis was performed using the standard protocol following bolus administration of intravenous contrast. RADIATION DOSE REDUCTION: This exam was performed according to the departmental dose-optimization program which includes automated exposure control, adjustment of the mA and/or kV according to patient size and/or use of iterative reconstruction technique. CONTRAST:  100mL OMNIPAQUE  IOHEXOL  300 MG/ML  SOLN COMPARISON:  None Available. FINDINGS: Lower chest: No acute abnormality. Hepatobiliary: No focal liver abnormality is seen. No gallstones, gallbladder wall thickening, or biliary dilatation. Pancreas: Unremarkable. No pancreatic ductal dilatation or surrounding inflammatory changes. Spleen: Mildly enlarged. Adrenals/Urinary Tract: Adrenal glands are unremarkable. Kidneys are normal, without renal calculi, focal lesion, or hydronephrosis. Bladder is unremarkable. Stomach/Bowel: Stomach is within normal limits. Appendix appears normal. No evidence of bowel wall thickening, distention, or inflammatory changes. 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 Aorta and IVC are normal in size. No enlarged lymph nodes are identified. There some prominent left inguinal lymph nodes. Reproductive: Prostate  is unremarkable. Other: Enhancing multiloculated fluid collection is seen in the inferior left perineum with surrounding inflammatory stranding measuring 7.8 x 3.3 x 3.8 cm. There is associated skin thickening. There is no ascites. There is no free air or focal abdominal wall hernia. Musculoskeletal: No fracture is seen. IMPRESSION: 1. 7.8 cm multiloculated abscess in the inferior left perineum with surrounding inflammatory stranding and skin thickening. 2. Prominent left inguinal lymph nodes, likely reactive. 3. Mild splenomegaly with splenic varices.  Is Electronically Signed   By: Greig Pique M.D.   On: 06/26/2024 22:06     Procedures   Medications Ordered in the ED  vancomycin  (VANCOREADY) IVPB 1500 mg/300 mL (1,500 mg Intravenous New Bag/Given 06/26/24 2217)  sodium chloride  0.9 % bolus 1,000 mL (has no administration in time range)  lactated ringers  infusion (has no administration in time range)  lactated ringers  bolus 1,000 mL (has no administration in time range)  sodium chloride  flush (NS) 0.9 % injection 3 mL (3 mLs Intravenous Given 06/26/24 2301)  sodium chloride  flush (NS) 0.9 % injection 3 mL (3 mLs Intravenous Given  06/26/24 2301)  sodium chloride  flush (NS) 0.9 % injection 3 mL (has no administration in time range)  0.9 %  sodium chloride  infusion (has no administration in time range)  acetaminophen  (TYLENOL ) tablet 650 mg (has no administration in time range)    Or  acetaminophen  (TYLENOL ) suppository 650 mg (has no administration in time range)  HYDROmorphone  (DILAUDID ) injection 0.5-1 mg (1 mg Intravenous Given 06/26/24 2258)  ondansetron  (ZOFRAN ) tablet 4 mg (has no administration in time range)    Or  ondansetron  (ZOFRAN ) injection 4 mg (has no administration in time range)  senna-docusate (Senokot-S) tablet 1 tablet (has no administration in time range)  piperacillin -tazobactam (ZOSYN ) IVPB 3.375 g (has no administration in time range)  vancomycin  (VANCOREADY) IVPB 1250  mg/250 mL (has no administration in time range)  clindamycin  (CLEOCIN ) IVPB 600 mg (0 mg Intravenous Stopped 06/26/24 2236)  piperacillin -tazobactam (ZOSYN ) IVPB 3.375 g (0 g Intravenous Stopped 06/26/24 2215)  iohexol  (OMNIPAQUE ) 300 MG/ML solution 100 mL (100 mLs Intravenous Contrast Given 06/26/24 2142)  HYDROmorphone  (DILAUDID ) injection 1 mg (1 mg Intravenous Given 06/26/24 2207)  lidocaine -EPINEPHrine  (XYLOCAINE  W/EPI) 2 %-1:200000 (PF) injection 20 mL (20 mLs Infiltration Given 06/26/24 2301)    Clinical Course as of 06/26/24 2305  Fri Jun 26, 2024  2136 Laboratory workup notable for leukocytosis with elevated venous lactate of 3.1.  Awaiting CT abdomen pelvis with contrast.  I have authorized stat CT despite having outstanding labs given concern for emergent condition [MP]  2236 Discussed with Dr. Ann (surgery).  She will evaluate patient in the ED and perform bedside incision and drainage.  Recommends continued IV antibiotics and admission to medicine service.  She will reassess patient tomorrow to look at the wound to ascertain need for OR drainage [MP]  2239 Discussed with admitting hospitalist accepts patient for admission.  Patient remains hemodynamically stable at this time.  Pain patient with IV Dilaudid  [MP]    Clinical Course User Index [MP] Pamella Ozell LABOR, DO                                 Medical Decision Making Patient presented for worsening abscess requiring drainage. Upon physical exam, found to have erythematous, indurated lesion extending into the scrotum. Differential diagnoses include Fournier's Gangrene vs sepsis vs testicular torsion vs scrotal cellulitis vs epididymitis vs bartholin gland abscess. CBC, BMP, Lactic acid, and blood cultures ordered. Due to the extensive nature of the infection and the location, will order CT Abd and Pelv. Will begin empiric Vancomycin , Zosyn , and Clindamycin .  9PM Lactic acid 3.1. Pending repeat. CBC shows leukocytosis of 12.7 with  a left shift 9.8. BMP negative. Pending blood cultures.   10PM CT Abd/Pelv shows 7.8cm multiloculated fluid collection in the inferior left perineum with surrounding inflammatory stranding measuring 7.8 x 3.3 x 3.8cm. There is associated skin thickening. No ascites. No free air or focal abdominal wall hernia Prominent left inguinal lymph node. Mild splenomegaly with splenic varices. Surgery, medicine consulted for admission.   1030PM Dr. Ann with surgery will come assess patient and try to drain abscess at bedside. Will prep I&D kit and lidocaine  for drainage procedure. Discussed plan with hospitalist, Dr. Sundil, who will admit patient for observation and medical management.   1045PM Lactic acid 3.1 > 2.61.  HIV, EtoH, hepatitis panel, urine drug screen and UA ordered.   Amount and/or Complexity of Data Reviewed Labs: ordered. Radiology: ordered.  Risk Prescription  drug management. Decision regarding hospitalization.       Final diagnoses:  None    ED Discharge Orders     None          Amani Nodarse, DO 06/26/24 2323    Pamella Ozell LABOR, DO 06/28/24 1835

## 2024-06-27 ENCOUNTER — Inpatient Hospital Stay (HOSPITAL_COMMUNITY): Payer: MEDICAID | Admitting: Anesthesiology

## 2024-06-27 ENCOUNTER — Other Ambulatory Visit: Payer: Self-pay

## 2024-06-27 ENCOUNTER — Encounter (HOSPITAL_COMMUNITY)
Admission: EM | Disposition: A | Discharge status: Home / Self Care | Payer: Self-pay | Source: Home / Self Care | Attending: Internal Medicine

## 2024-06-27 DIAGNOSIS — L02215 Cutaneous abscess of perineum: Secondary | ICD-10-CM

## 2024-06-27 HISTORY — PX: INCISION AND DRAINAGE ABSCESS: SHX5864

## 2024-06-27 LAB — COMPREHENSIVE METABOLIC PANEL WITH GFR
ALT: 33 U/L (ref 0–44)
AST: 23 U/L (ref 15–41)
Albumin: 3.1 g/dL — ABNORMAL LOW (ref 3.5–5.0)
Alkaline Phosphatase: 56 U/L (ref 38–126)
Anion gap: 7 (ref 5–15)
BUN: 11 mg/dL (ref 6–20)
CO2: 25 mmol/L (ref 22–32)
Calcium: 8.7 mg/dL — ABNORMAL LOW (ref 8.9–10.3)
Chloride: 104 mmol/L (ref 98–111)
Creatinine, Ser: 0.8 mg/dL (ref 0.61–1.24)
GFR, Estimated: >60 mL/min (ref >60)
Glucose, Bld: 101 mg/dL — ABNORMAL HIGH (ref 70–99)
Potassium: 3.8 mmol/L (ref 3.5–5.1)
Sodium: 136 mmol/L (ref 135–145)
Total Bilirubin: 0.9 mg/dL (ref 0.0–1.2)
Total Protein: 6.4 g/dL — ABNORMAL LOW (ref 6.5–8.1)

## 2024-06-27 LAB — CBC
HCT: 35.3 % — ABNORMAL LOW (ref 39.0–52.0)
Hemoglobin: 11.7 g/dL — ABNORMAL LOW (ref 13.0–17.0)
MCH: 30.6 pg (ref 26.0–34.0)
MCHC: 33.1 g/dL (ref 30.0–36.0)
MCV: 92.4 fL (ref 80.0–100.0)
Platelets: 160 K/uL (ref 150–400)
RBC: 3.82 MIL/uL — ABNORMAL LOW (ref 4.22–5.81)
RDW: 13.2 % (ref 11.5–15.5)
WBC: 11.2 K/uL — ABNORMAL HIGH (ref 4.0–10.5)
nRBC: 0 % (ref 0.0–0.2)

## 2024-06-27 LAB — RAPID URINE DRUG SCREEN, HOSP PERFORMED
Amphetamines: NOT DETECTED
Barbiturates: POSITIVE — AB
Benzodiazepines: NOT DETECTED
Cocaine: NOT DETECTED
Opiates: POSITIVE — AB
Tetrahydrocannabinol: NOT DETECTED

## 2024-06-27 LAB — HEPATITIS PANEL, ACUTE
HCV Ab: NONREACTIVE
Hep A IgM: NONREACTIVE
Hep B C IgM: NONREACTIVE
Hepatitis B Surface Ag: NONREACTIVE

## 2024-06-27 LAB — URINALYSIS, ROUTINE W REFLEX MICROSCOPIC
Bilirubin Urine: NEGATIVE
Glucose, UA: NEGATIVE mg/dL
Hgb urine dipstick: NEGATIVE
Ketones, ur: NEGATIVE mg/dL
Leukocytes,Ua: NEGATIVE
Nitrite: NEGATIVE
Protein, ur: NEGATIVE mg/dL
Specific Gravity, Urine: 1.044 — ABNORMAL HIGH (ref 1.005–1.030)
pH: 5 (ref 5.0–8.0)

## 2024-06-27 LAB — I-STAT CG4 LACTIC ACID, ED: Lactic Acid, Venous: 0.6 mmol/L (ref 0.5–1.9)

## 2024-06-27 LAB — ETHANOL: Alcohol, Ethyl (B): <15 mg/dL (ref <15)

## 2024-06-27 LAB — HIV ANTIBODY (ROUTINE TESTING W REFLEX): HIV Screen 4th Generation wRfx: NONREACTIVE

## 2024-06-27 SURGERY — INCISION AND DRAINAGE, ABSCESS
Anesthesia: General

## 2024-06-27 MED ORDER — SODIUM CHLORIDE 0.9% FLUSH: 10.0000 mL | INTRAVENOUS | Status: DC | PRN

## 2024-06-27 MED ORDER — DEXAMETHASONE SODIUM PHOSPHATE 10 MG/ML IJ SOLN
INTRAMUSCULAR | Status: DC | PRN
Start: 2024-06-27 — End: 2024-06-27
  Administered 2024-06-27: 8 mg via INTRAVENOUS

## 2024-06-27 MED ORDER — ONDANSETRON HCL 4 MG/2ML IJ SOLN
INTRAMUSCULAR | Status: AC
  Filled 2024-06-27: qty 2

## 2024-06-27 MED ORDER — LIDOCAINE HCL (PF) 2 % IJ SOLN
INTRAMUSCULAR | Status: AC
  Filled 2024-06-27: qty 5

## 2024-06-27 MED ORDER — LIDOCAINE HCL (CARDIAC) PF 100 MG/5ML IV SOSY
PREFILLED_SYRINGE | INTRAVENOUS | Status: DC | PRN
Start: 2024-06-27 — End: 2024-06-27
  Administered 2024-06-27: 80 mg via INTRAVENOUS

## 2024-06-27 MED ORDER — 0.9 % SODIUM CHLORIDE (POUR BTL) OPTIME
TOPICAL | Status: DC | PRN
  Administered 2024-06-27: 1000 mL

## 2024-06-27 MED ORDER — ONDANSETRON HCL 4 MG/2ML IJ SOLN
INTRAMUSCULAR | Status: DC | PRN
  Administered 2024-06-27: 4 mg via INTRAVENOUS

## 2024-06-27 MED ORDER — LACTATED RINGERS IV SOLN
INTRAVENOUS | Status: DC | PRN
Start: 2024-06-27 — End: 2024-06-27

## 2024-06-27 MED ORDER — HYDROMORPHONE HCL 1 MG/ML IJ SOLN: 0.2500 mg | INTRAMUSCULAR | Status: DC | PRN

## 2024-06-27 MED ORDER — HYDROMORPHONE HCL 1 MG/ML IJ SOLN
1.0000 mg | Freq: Once | INTRAMUSCULAR | Status: AC
  Administered 2024-06-27: 1 mg via INTRAVENOUS
  Filled 2024-06-27: qty 1

## 2024-06-27 MED ORDER — MIDAZOLAM HCL 2 MG/2ML IJ SOLN
INTRAMUSCULAR | Status: AC
  Filled 2024-06-27: qty 2

## 2024-06-27 MED ORDER — PROPOFOL 10 MG/ML IV BOLUS
INTRAVENOUS | Status: DC | PRN
  Administered 2024-06-27: 170 mg via INTRAVENOUS

## 2024-06-27 MED ORDER — DEXMEDETOMIDINE HCL IN NACL 80 MCG/20ML IV SOLN
INTRAVENOUS | Status: AC
  Filled 2024-06-27: qty 20

## 2024-06-27 MED ORDER — KETAMINE HCL 50 MG/5ML IJ SOSY
PREFILLED_SYRINGE | INTRAMUSCULAR | Status: DC | PRN
Start: 2024-06-27 — End: 2024-06-27
  Administered 2024-06-27 (×2): 10 mg via INTRAVENOUS

## 2024-06-27 MED ORDER — DEXMEDETOMIDINE HCL IN NACL 80 MCG/20ML IV SOLN
INTRAVENOUS | Status: DC | PRN
  Administered 2024-06-27 (×4): 4 ug via INTRAVENOUS

## 2024-06-27 MED ORDER — PROPOFOL 10 MG/ML IV BOLUS
INTRAVENOUS | Status: AC
  Filled 2024-06-27: qty 20

## 2024-06-27 MED ORDER — ORAL CARE MOUTH RINSE: 15.0000 mL | OROMUCOSAL | Status: DC | PRN

## 2024-06-27 MED ORDER — KETAMINE HCL 50 MG/5ML IJ SOSY
PREFILLED_SYRINGE | INTRAMUSCULAR | Status: AC
  Filled 2024-06-27: qty 5

## 2024-06-27 MED ORDER — FENTANYL CITRATE (PF) 100 MCG/2ML IJ SOLN
INTRAMUSCULAR | Status: DC | PRN
  Administered 2024-06-27 (×2): 50 ug via INTRAVENOUS

## 2024-06-27 MED ORDER — DROPERIDOL 2.5 MG/ML IJ SOLN: 0.6250 mg | Freq: Once | INTRAMUSCULAR | Status: DC | PRN

## 2024-06-27 MED ORDER — MIDAZOLAM HCL 5 MG/5ML IJ SOLN
INTRAMUSCULAR | Status: DC | PRN
  Administered 2024-06-27: 2 mg via INTRAVENOUS

## 2024-06-27 MED ORDER — FENTANYL CITRATE (PF) 100 MCG/2ML IJ SOLN
INTRAMUSCULAR | Status: AC
  Filled 2024-06-27: qty 2

## 2024-06-27 MED ORDER — OXYCODONE HCL 5 MG PO TABS
5.0000 mg | ORAL_TABLET | ORAL | Status: DC | PRN
  Administered 2024-06-27 – 2024-06-29 (×10): 10 mg via ORAL
  Filled 2024-06-27 (×10): qty 2

## 2024-06-27 MED ORDER — BUPIVACAINE-EPINEPHRINE (PF) 0.25% -1:200000 IJ SOLN
INTRAMUSCULAR | Status: AC
  Filled 2024-06-27: qty 30

## 2024-06-27 MED ORDER — DEXAMETHASONE SODIUM PHOSPHATE 10 MG/ML IJ SOLN
INTRAMUSCULAR | Status: AC
Start: 2024-06-27 — End: 2024-06-27
  Filled 2024-06-27: qty 1

## 2024-06-27 SURGICAL SUPPLY — 30 items
BAG COUNTER SPONGE SURGICOUNT (BAG) IMPLANT
BLADE SURG 15 STRL LF DISP TIS (BLADE) ×1 IMPLANT
BNDG GAUZE DERMACEA FLUFF 4 (GAUZE/BANDAGES/DRESSINGS) IMPLANT
DRAIN PENROSE 0.25X18 (DRAIN) IMPLANT
DRAPE LAPAROSCOPIC ABDOMINAL (DRAPES) IMPLANT
DRAPE UTILITY XL STRL (DRAPES) ×1 IMPLANT
ELECT PENCIL ROCKER SW 15FT (MISCELLANEOUS) ×1 IMPLANT
ELECT REM PT RETURN 15FT ADLT (MISCELLANEOUS) ×1 IMPLANT
GAUZE PAD ABD 8X10 STRL (GAUZE/BANDAGES/DRESSINGS) IMPLANT
GAUZE SPONGE 4X4 12PLY STRL (GAUZE/BANDAGES/DRESSINGS) IMPLANT
GLOVE BIO SURGEON STRL SZ7 (GLOVE) ×1 IMPLANT
GLOVE BIOGEL PI IND STRL 7.5 (GLOVE) ×1 IMPLANT
GOWN STRL REUS W/ TWL LRG LVL3 (GOWN DISPOSABLE) ×1 IMPLANT
KIT BASIN OR (CUSTOM PROCEDURE TRAY) ×1 IMPLANT
KIT TURNOVER KIT A (KITS) ×1 IMPLANT
NDL HYPO 25X1 1.5 SAFETY (NEEDLE) IMPLANT
NEEDLE HYPO 25X1 1.5 SAFETY (NEEDLE) IMPLANT
NS IRRIG 1000ML POUR BTL (IV SOLUTION) ×1 IMPLANT
PACK BASIC VI WITH GOWN DISP (CUSTOM PROCEDURE TRAY) ×1 IMPLANT
SPIKE FLUID TRANSFER (MISCELLANEOUS) IMPLANT
SPONGE T-LAP 18X18 ~~LOC~~+RFID (SPONGE) ×1 IMPLANT
SUT ETHILON 2 0 PS N (SUTURE) IMPLANT
SUT MNCRL AB 4-0 PS2 18 (SUTURE) IMPLANT
SUT VIC AB 3-0 SH 27XBRD (SUTURE) IMPLANT
SWAB COLLECTION DEVICE MRSA (MISCELLANEOUS) IMPLANT
SWAB CULTURE ESWAB REG 1ML (MISCELLANEOUS) IMPLANT
SYR BULB IRRIG 60ML STRL (SYRINGE) ×1 IMPLANT
SYR CONTROL 10ML LL (SYRINGE) IMPLANT
TOWEL OR 17X26 10 PK STRL BLUE (TOWEL DISPOSABLE) ×1 IMPLANT
YANKAUER SUCT BULB TIP 10FT TU (MISCELLANEOUS) ×1 IMPLANT

## 2024-06-27 NOTE — H&P (View-Only) (Signed)
 Progress Note     Interval: Still with significant pain, Tmax 100.4 overnight. WBC 11.2 from 12.7 but CBC overall looks dilutional.  Objective: Vital signs in last 24 hours: Temp:  [98.8 F (37.1 C)-100.4 F (38 C)] 99.2 F (37.3 C) (07/26 0328) Pulse Rate:  [94-128] 94 (07/26 0328) Resp:  [13-20] 20 (07/26 0328) BP: (99-140)/(61-99) 125/72 (07/26 0328) SpO2:  [97 %-100 %] 99 % (07/26 0328) Weight:  [70.3 kg] 70.3 kg (07/25 1949) Last BM Date : 06/24/24 (per patient)  Intake/Output from previous day: 07/25 0701 - 07/26 0700 In: 2382.2 [I.V.:35.4; IV Piggyback:2346.8] Out: 950 [Urine:950] Intake/Output this shift: No intake/output data recorded.  PE: General: No acute distress Heart: regular, rate, and rhythm Lungs: Normal work of breathing on room air Rectal/Back: Left sided perineal abscess, decreased erythema/induration to area surrounding cruciate incision but still induration and erythema more anteriorly. Purulent drainage.   Lab Results:  Recent Labs    06/26/24 2103 06/27/24 0456  WBC 12.7* 11.2*  HGB 13.6 11.7*  HCT 41.2 35.3*  PLT 196 160   BMET Recent Labs    06/26/24 2103 06/27/24 0456  NA 134* 136  K 3.6 3.8  CL 98 104  CO2 24 25  GLUCOSE 121* 101*  BUN 14 11  CREATININE 0.90 0.80  CALCIUM 9.4 8.7*   PT/INR No results for input(s): LABPROT, INR in the last 72 hours. CMP     Component Value Date/Time   NA 136 06/27/2024 0456   K 3.8 06/27/2024 0456   CL 104 06/27/2024 0456   CO2 25 06/27/2024 0456   GLUCOSE 101 (H) 06/27/2024 0456   BUN 11 06/27/2024 0456   CREATININE 0.80 06/27/2024 0456   CREATININE 0.88 04/23/2014 1117   CALCIUM 8.7 (L) 06/27/2024 0456   PROT 6.4 (L) 06/27/2024 0456   ALBUMIN 3.1 (L) 06/27/2024 0456   AST 23 06/27/2024 0456   ALT 33 06/27/2024 0456   ALKPHOS 56 06/27/2024 0456   BILITOT 0.9 06/27/2024 0456   GFRNONAA >60 06/27/2024 0456   GFRAA >90 07/13/2012 2025   Lipase  No results found for:  LIPASE     Studies/Results: CT ABDOMEN PELVIS W CONTRAST Result Date: 06/26/2024 CLINICAL DATA:  Evaluate for buttocks or perineal abscess. EXAM: CT ABDOMEN AND PELVIS WITH CONTRAST TECHNIQUE: Multidetector CT imaging of the abdomen and pelvis was performed using the standard protocol following bolus administration of intravenous contrast. RADIATION DOSE REDUCTION: This exam was performed according to the departmental dose-optimization program which includes automated exposure control, adjustment of the mA and/or kV according to patient size and/or use of iterative reconstruction technique. CONTRAST:  OMNIPAQUE  IOHEXOL  300 MG/ML  SOLN COMPARISON:  None Available. FINDINGS: Lower chest: No acute abnormality. Hepatobiliary: No focal liver abnormality is seen. No gallstones, gallbladder wall thickening, or biliary dilatation. Pancreas: Unremarkable. No pancreatic ductal dilatation or surrounding inflammatory changes. Spleen: Mildly enlarged. Adrenals/Urinary Tract: Adrenal glands are unremarkable. Kidneys are normal, without renal calculi, focal lesion, or hydronephrosis. Bladder is unremarkable. Stomach/Bowel: Stomach is within normal limits. Appendix appears normal. No evidence of bowel wall thickening, distention, or inflammatory changes. 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 Aorta and IVC are normal in size. No enlarged lymph nodes are identified. There some prominent left inguinal lymph nodes. Reproductive: Prostate is unremarkable. Other: Enhancing multiloculated fluid collection is seen in the inferior left perineum with surrounding inflammatory stranding measuring 7.8 x 3.3 x 3.8 cm. There is associated skin thickening. There is no ascites. There is no free  air or focal abdominal wall hernia. Musculoskeletal: No fracture is seen. IMPRESSION: 1. 7.8 cm multiloculated abscess in the inferior left perineum with surrounding inflammatory stranding and skin thickening. 2. Prominent left  inguinal lymph nodes, likely reactive. 3. Mild splenomegaly with splenic varices.  Is Electronically Signed   By: Greig Pique M.D.   On: 06/26/2024 22:06     Assessment/Plan 38 yo M with perineal abscess  LOS: 1 day   - Continue IV antibiotics - NPO, can have regular diet post-op  - Will proceed to OR with Dr. Belinda for incision and drainage of perineal abscess. Patient had difficulty tolerating bedside I&D last night, do not think he would tolerate any further attempts.  - We discussed risks of procedure including but not limited to: bleeding, infection, incomplete drainage, need for return to OR, injury to surrounding structures, pain. Patient voiced understanding and wishes to proceed.   I reviewed hospitalist notes, last 24 h vitals and pain scores, last 48 h intake and output, last 24 h labs and trends, and last 24 h imaging results.  This care required moderate level of medical decision making.    Richerd Silversmith, MD [facility] Surgery 06/27/2024, 10:10 AM Please see Amion for pager number during day hours 7:00am-4:30pm

## 2024-06-27 NOTE — Anesthesia Postprocedure Evaluation (Signed)
 Anesthesia Post Note  Patient: Alfred Woodard  Procedure(s) Performed: INCISION AND DRAINAGE, ABSCESS     Patient location during evaluation: PACU Anesthesia Type: General Level of consciousness: awake and alert Pain management: pain level controlled Vital Signs Assessment: post-procedure vital signs reviewed and stable Respiratory status: spontaneous breathing, nonlabored ventilation, respiratory function stable and patient connected to nasal cannula oxygen Cardiovascular status: blood pressure returned to baseline and stable Postop Assessment: no apparent nausea or vomiting Anesthetic complications: no   No notable events documented.  Last Vitals:  Vitals:   06/27/24 1200 06/27/24 1219  BP: (!) 119/98 122/84  Pulse: 77 77  Resp: 10 12  Temp:  (!) 36.4 C  SpO2: 99% 100%    Last Pain:  Vitals:   06/27/24 1553  TempSrc:   PainSc: 7                  Ladarius Seubert P Ben Habermann

## 2024-06-27 NOTE — Op Note (Signed)
 Pre-Op diagnosis: Perineal abscess Postop diagnosis: Same Procedure performed: Incision and drainage of perineal abscess with placement of Penrose drain Surgeon:Lisl Slingerland K Mercedies Ganesh Anesthesia: General Indications: This is a 38 year old male with a history of IV drug abuse, polysubstance abuse, and bipolar disorder who presented with several days of a worsening left buttock/perineal abscess.  He was examined by Dr. Ann.  CT scan showed a 7.8 cm loculated abscess in the inferior perineum extending to the left side of the buttocks.  White blood cell count was 12.7.  She performed incision and drainage at the bedside under local anesthetic.  She encountered some purulent fluid.  Today, white blood cell count is marginally improved but he has more swelling induration towards the perineum.  We recommended surgical exploration for drainage.  Description of procedure: The patient was brought to the operating room and placed in the supine position on the operating room table.  After an adequate level of general anesthesia was obtained, his legs were placed in the lithotomy position in yellowfin stirrups.  His perineum was prepped with Betadine and draped sterile fashion.  A timeout was taken to ensure the proper patient and proper procedure.  The previous incision is open in the medial left buttocks.  There is some purulent fluid in this area.  The patient has a longitudinal area of thickening extending towards the base of the scrotum.  I explored the abscess cavity through the previous incision.  This does not extend all the way up to the perineum.  The seem to be separate loculated abscesses.  I made a 1.5 cm round incision directly over the center of the perineal abscess.  We encountered some purulent fluid.  We broke up any loculations manually.  I was able to pass a Kelly clamp from the perineal incision down to the buttock incision.  We then irrigated the entire abscess cavity thoroughly with sterile saline.   Hemostasis was obtained with cautery.  I passed a Penrose drain from the perineal incision out of the buttock incision.  We secured both ends of the Penrose drain with 2-0 Prolene sutures.  A dry dressing is applied.  The patient was then brought back to supine position.  He was extubated and brought to the recovery in stable condition.  All sponge, instrument, and needle counts are correct.  Donnice POUR. Belinda, MD, [facility] Surgery  General Surgery   06/27/2024 11:28 AM

## 2024-06-27 NOTE — Transfer of Care (Signed)
 Immediate Anesthesia Transfer of Care Note  Patient: Alfred Woodard  Procedure(s) Performed: INCISION AND DRAINAGE, ABSCESS  Patient Location: PACU  Anesthesia Type:General  Level of Consciousness: sedated and responds to stimulation  Airway & Oxygen Therapy: Patient Spontanous Breathing and Patient connected to face mask oxygen  Post-op Assessment: Report given to RN and Post -op Vital signs reviewed and stable  Post vital signs: Reviewed and stable  Last Vitals:  Vitals Value Taken Time  BP 112/68 06/27/24 11:30  Temp 36.6 C 06/27/24 11:29  Pulse 68 06/27/24 11:32  Resp 22 06/27/24 11:32  SpO2 100 % 06/27/24 11:32  Vitals shown include unfiled device data.  Last Pain:  Vitals:   06/27/24 1001  TempSrc:   PainSc: 7          Complications: No notable events documented.

## 2024-06-27 NOTE — Progress Notes (Signed)
 Progress Note   Patient: Alfred Woodard FMW:[id number] DOB: March 05, 1986 DOA: 06/26/2024     1 DOS: the patient was seen and examined on 06/27/2024   Brief hospital course:  From HPI Alfred Woodard is a 38 y.o. male with medical history significant of ADD, bipolar disorder, depression, chronic back pain, IV drug use, history of polysubstance use and erectile dysfunction presented emergency department complaining of worsening left-sided buttock abscess. States initially the abscess began as an ingrown hair that progressively worsened to a boil about 4 days ago. The boil ruptured 2 days ago after the patient attempted to drain it, and he states that his pain and his fevers began around then. His infection is spreading inward towards his perineum and left testicle. This is making it painful to defecate or urinate, and has not eaten much the last few days to reduce the need to use the restroom. His pain is a 10/10 and is sharp/throbbing in nature. States he has had fevers 99-100 degrees at home and at [facility] earlier today, although he is afebrile here. Has had SOB, chills, nausea and vomiting.   Patient reported that waves of pain down his left leg, dysuria, painful defecation, and swollen glands near the scrotum.    Assessment and Plan: Perineal abscess Sepsis secondary to abscess Presented with tachycardia and temperature up to 100.4 Patient is status post abscess drainage on 06/27/2024 Presented to emergency department complaining of worsening left buttock abscess, low-grade fever at home, chills, dysuria, painful defecation and erection CT abdomen pelvis showed 7.8 cm multiloculated abscess in the inferior left perineum  General surgery on board and case discussed Continue IV vancomycin  and Zosyn .     History of IV drug use and polysubstance abuse -Patient has history of IV drug use.   Follow-up HIV, hepatitis panel.      DVT prophylaxis:  SCDs  Code Status:  Full Code  Disposition Plan:  Pending I&D of the perineal abscess at bedside.  Consults: General Surgery  Admission status:   Inpatient, Telemetry bed   Family Communication: None present at bedside  Disposition: Pending surgical clearance  Status is: Inpatient  Time spent: 52 minutes  Subjective:  Patient seen and examined at bedside this morning Still has some pain involving the perineal region Underwent surgical intervention today Plan of care discussed with surgery team Patient spiked temperature up to 100.4  Physical Exam: Vitals and nursing note reviewed.   Pulmonary:     Effort: Pulmonary effort is normal.     Breath sounds: Normal breath sounds.  Abdominal    Palpations: Abdomen is soft.  Musculoskeletal:     Cervical back: Normal range of motion and neck supple.  Skin:    General: Skin is dry.     Capillary Refill: Capillary refill takes less than 2 seconds.  Neurological:     Mental Status: He is alert and oriented to person, place, and time.  Psychiatric:        Mood and Affect: Mood normal.        Thought Content: Thought content normal.   Data Reviewed: 7.8 cm multiloculated abscess in the inferior left peritoneum seen on CT pelvics    Latest Ref Rng & Units 06/27/2024    4:56 AM 06/26/2024    9:03 PM 04/23/2014   11:17 AM  CBC  WBC 4.0 - 10.5 K/uL 11.2  12.7  5.2   Hemoglobin 13.0 - 17.0 g/dL 88.2  86.3  83.8   Hematocrit 39.0 - 52.0 %  35.3  41.2  46.0   Platelets 150 - 400 K/uL 160  196  124        Latest Ref Rng & Units 06/27/2024    4:56 AM 06/26/2024    9:03 PM 04/23/2014   11:17 AM  BMP  Glucose 70 - 99 mg/dL 898  878  76   BUN 6 - 20 mg/dL 11  14  9    Creatinine 0.61 - 1.24 mg/dL 9.19  9.09  9.11   Sodium 135 - 145 mmol/L 136  134  137   Potassium 3.5 - 5.1 mmol/L 3.8  3.6  4.0   Chloride 98 - 111 mmol/L 104  98  101   CO2 22 - 32 mmol/L 25  24  27    Calcium 8.9 - 10.3 mg/dL 8.7  9.4  9.8      Vitals:   06/27/24 1130 06/27/24 1145 06/27/24 1200 06/27/24 1219   BP: 112/68 119/68 (!) 119/98 122/84  Pulse: 70 70 77 77  Resp: (!) 22 19 10 12   Temp:    (!) 97.5 F (36.4 C)  TempSrc:    Oral  SpO2: 100% 100% 99% 100%  Weight:      Height:        Author: Drue ONEIDA Potter, MD 06/27/2024 3:20 PM  For on call review [email or web address].

## 2024-06-27 NOTE — Anesthesia Preprocedure Evaluation (Signed)
 Anesthesia Evaluation  Patient identified by MRN, date of birth, ID band Patient awake    Reviewed: Allergy & Precautions, NPO status , Patient's Chart, lab work & pertinent test results  Airway Mallampati: II  TM Distance: >3 FB Neck ROM: Full    Dental no notable dental hx.    Pulmonary neg pulmonary ROS, Current Smoker   Pulmonary exam normal        Cardiovascular negative cardio ROS  Rhythm:Regular Rate:Normal     Neuro/Psych    Depression Bipolar Disorder   negative neurological ROS     GI/Hepatic Neg liver ROS,,,Perineal abscess   Endo/Other  negative endocrine ROS    Renal/GU negative Renal ROS  negative genitourinary   Musculoskeletal negative musculoskeletal ROS (+)    Abdominal Normal abdominal exam  (+)   Peds  Hematology Lab Results      Component                Value               Date                      WBC                      11.2 (H)            06/27/2024                HGB                      11.7 (L)            06/27/2024                HCT                      35.3 (L)            06/27/2024                MCV                      92.4                06/27/2024                PLT                      160                 06/27/2024             Lab Results      Component                Value               Date                      NA                       136                 06/27/2024                K  3.8                 06/27/2024                CO2                      25                  06/27/2024                GLUCOSE                  101 (H)             06/27/2024                BUN                      11                  06/27/2024                CREATININE               0.80                06/27/2024                CALCIUM                  8.7 (L)             06/27/2024                GFRNONAA                 >60                 06/27/2024               Anesthesia Other Findings   Reproductive/Obstetrics                              Anesthesia Physical Anesthesia Plan  ASA: 2  Anesthesia Plan: General   Post-op Pain Management: Tylenol  PO (pre-op)*   Induction: Intravenous  PONV Risk Score and Plan: 1 and Ondansetron , Dexamethasone , Midazolam  and Treatment may vary due to age or medical condition  Airway Management Planned: Mask and LMA  Additional Equipment: None  Intra-op Plan:   Post-operative Plan: Extubation in OR  Informed Consent: I have reviewed the patients History and Physical, chart, labs and discussed the procedure including the risks, benefits and alternatives for the proposed anesthesia with the patient or authorized representative who has indicated his/her understanding and acceptance.     Dental advisory given  Plan Discussed with: CRNA  Anesthesia Plan Comments:         Anesthesia Quick Evaluation

## 2024-06-27 NOTE — Anesthesia Procedure Notes (Signed)
 Procedure Name: LMA Insertion Date/Time: 06/27/2024 10:55 AM  Performed by: Obadiah Reyes BROCKS, CRNAPre-anesthesia Checklist: Patient identified, Emergency Drugs available, Suction available, Patient being monitored and Timeout performed Patient Re-evaluated:Patient Re-evaluated prior to induction Oxygen Delivery Method: Circle system utilized, Simple face mask and Ambu bag Preoxygenation: Pre-oxygenation with 100% oxygen Induction Type: IV induction LMA: LMA inserted LMA Size: 5.0 Number of attempts: 1 Placement Confirmation: positive ETCO2

## 2024-06-27 NOTE — Interval H&P Note (Signed)
 History and Physical Interval Note:  06/27/2024 10:47 AM  Alfred Woodard  has presented today for surgery, with the diagnosis of perineal abscess.  The various methods of treatment have been discussed with the patient and family. After consideration of risks, benefits and other options for treatment, the patient has consented to  Procedure(s): INCISION AND DRAINAGE, ABSCESS (N/A) as a surgical intervention.  The patient's history has been reviewed, patient examined, no change in status, stable for surgery.  I have reviewed the patient's chart and labs.  Questions were answered to the patient's satisfaction.     [name]

## 2024-06-27 NOTE — Progress Notes (Signed)
 Progress Note     Interval: Still with significant pain, Tmax 100.4 overnight. WBC 11.2 from 12.7 but CBC overall looks dilutional.  Objective: Vital signs in last 24 hours: Temp:  [98.8 F (37.1 C)-100.4 F (38 C)] 99.2 F (37.3 C) (07/26 0328) Pulse Rate:  [94-128] 94 (07/26 0328) Resp:  [13-20] 20 (07/26 0328) BP: (99-140)/(61-99) 125/72 (07/26 0328) SpO2:  [97 %-100 %] 99 % (07/26 0328) Weight:  [70.3 kg] 70.3 kg (07/25 1949) Last BM Date : 06/24/24 (per patient)  Intake/Output from previous day: 07/25 0701 - 07/26 0700 In: 2382.2 [I.V.:35.4; IV Piggyback:2346.8] Out: 950 [Urine:950] Intake/Output this shift: No intake/output data recorded.  PE: General: No acute distress Heart: regular, rate, and rhythm Lungs: Normal work of breathing on room air Rectal/Back: Left sided perineal abscess, decreased erythema/induration to area surrounding cruciate incision but still induration and erythema more anteriorly. Purulent drainage.   Lab Results:  Recent Labs    06/26/24 2103 06/27/24 0456  WBC 12.7* 11.2*  HGB 13.6 11.7*  HCT 41.2 35.3*  PLT 196 160   BMET Recent Labs    06/26/24 2103 06/27/24 0456  NA 134* 136  K 3.6 3.8  CL 98 104  CO2 24 25  GLUCOSE 121* 101*  BUN 14 11  CREATININE 0.90 0.80  CALCIUM 9.4 8.7*   PT/INR No results for input(s): LABPROT, INR in the last 72 hours. CMP     Component Value Date/Time   NA 136 06/27/2024 0456   K 3.8 06/27/2024 0456   CL 104 06/27/2024 0456   CO2 25 06/27/2024 0456   GLUCOSE 101 (H) 06/27/2024 0456   BUN 11 06/27/2024 0456   CREATININE 0.80 06/27/2024 0456   CREATININE 0.88 04/23/2014 1117   CALCIUM 8.7 (L) 06/27/2024 0456   PROT 6.4 (L) 06/27/2024 0456   ALBUMIN 3.1 (L) 06/27/2024 0456   AST 23 06/27/2024 0456   ALT 33 06/27/2024 0456   ALKPHOS 56 06/27/2024 0456   BILITOT 0.9 06/27/2024 0456   GFRNONAA >60 06/27/2024 0456   GFRAA >90 07/13/2012 2025   Lipase  No results found for:  LIPASE     Studies/Results: CT ABDOMEN PELVIS W CONTRAST Result Date: 06/26/2024 CLINICAL DATA:  Evaluate for buttocks or perineal abscess. EXAM: CT ABDOMEN AND PELVIS WITH CONTRAST TECHNIQUE: Multidetector CT imaging of the abdomen and pelvis was performed using the standard protocol following bolus administration of intravenous contrast. RADIATION DOSE REDUCTION: This exam was performed according to the departmental dose-optimization program which includes automated exposure control, adjustment of the mA and/or kV according to patient size and/or use of iterative reconstruction technique. CONTRAST:  OMNIPAQUE  IOHEXOL  300 MG/ML  SOLN COMPARISON:  None Available. FINDINGS: Lower chest: No acute abnormality. Hepatobiliary: No focal liver abnormality is seen. No gallstones, gallbladder wall thickening, or biliary dilatation. Pancreas: Unremarkable. No pancreatic ductal dilatation or surrounding inflammatory changes. Spleen: Mildly enlarged. Adrenals/Urinary Tract: Adrenal glands are unremarkable. Kidneys are normal, without renal calculi, focal lesion, or hydronephrosis. Bladder is unremarkable. Stomach/Bowel: Stomach is within normal limits. Appendix appears normal. No evidence of bowel wall thickening, distention, or inflammatory changes. 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 Aorta and IVC are normal in size. No enlarged lymph nodes are identified. There some prominent left inguinal lymph nodes. Reproductive: Prostate is unremarkable. Other: Enhancing multiloculated fluid collection is seen in the inferior left perineum with surrounding inflammatory stranding measuring 7.8 x 3.3 x 3.8 cm. There is associated skin thickening. There is no ascites. There is no free  air or focal abdominal wall hernia. Musculoskeletal: No fracture is seen. IMPRESSION: 1. 7.8 cm multiloculated abscess in the inferior left perineum with surrounding inflammatory stranding and skin thickening. 2. Prominent left  inguinal lymph nodes, likely reactive. 3. Mild splenomegaly with splenic varices.  Is Electronically Signed   By: Greig Pique M.D.   On: 06/26/2024 22:06     Assessment/Plan 38 yo M with perineal abscess  LOS: 1 day   - Continue IV antibiotics - NPO, can have regular diet post-op  - Will proceed to OR with Dr. Belinda for incision and drainage of perineal abscess. Patient had difficulty tolerating bedside I&D last night, do not think he would tolerate any further attempts.  - We discussed risks of procedure including but not limited to: bleeding, infection, incomplete drainage, need for return to OR, injury to surrounding structures, pain. Patient voiced understanding and wishes to proceed.   I reviewed hospitalist notes, last 24 h vitals and pain scores, last 48 h intake and output, last 24 h labs and trends, and last 24 h imaging results.  This care required moderate level of medical decision making.    Richerd Silversmith, MD [facility] Surgery 06/27/2024, 10:10 AM Please see Amion for pager number during day hours 7:00am-4:30pm

## 2024-06-28 ENCOUNTER — Encounter (HOSPITAL_COMMUNITY): Payer: Self-pay | Admitting: Surgery

## 2024-06-28 DIAGNOSIS — L02215 Cutaneous abscess of perineum: Secondary | ICD-10-CM | POA: Diagnosis not present

## 2024-06-28 LAB — BASIC METABOLIC PANEL WITH GFR
Anion gap: 10 (ref 5–15)
BUN: 11 mg/dL (ref 6–20)
CO2: 24 mmol/L (ref 22–32)
Calcium: 8.8 mg/dL — ABNORMAL LOW (ref 8.9–10.3)
Chloride: 102 mmol/L (ref 98–111)
Creatinine, Ser: 0.82 mg/dL (ref 0.61–1.24)
GFR, Estimated: >60 mL/min (ref >60)
Glucose, Bld: 197 mg/dL — ABNORMAL HIGH (ref 70–99)
Potassium: 3.3 mmol/L — ABNORMAL LOW (ref 3.5–5.1)
Sodium: 136 mmol/L (ref 135–145)

## 2024-06-28 LAB — CBC WITH DIFFERENTIAL/PLATELET
Abs Immature Granulocytes: 0.07 K/uL (ref 0.00–0.07)
Basophils Absolute: 0 K/uL (ref 0.0–0.1)
Basophils Relative: 0 %
Eosinophils Absolute: 0.1 K/uL (ref 0.0–0.5)
Eosinophils Relative: 0 %
HCT: 36.9 % — ABNORMAL LOW (ref 39.0–52.0)
Hemoglobin: 12.5 g/dL — ABNORMAL LOW (ref 13.0–17.0)
Immature Granulocytes: 1 %
Lymphocytes Relative: 14 %
Lymphs Abs: 1.9 K/uL (ref 0.7–4.0)
MCH: 30.7 pg (ref 26.0–34.0)
MCHC: 33.9 g/dL (ref 30.0–36.0)
MCV: 90.7 fL (ref 80.0–100.0)
Monocytes Absolute: 0.6 K/uL (ref 0.1–1.0)
Monocytes Relative: 5 %
Neutro Abs: 10.7 K/uL — ABNORMAL HIGH (ref 1.7–7.7)
Neutrophils Relative %: 80 %
Platelets: 203 K/uL (ref 150–400)
RBC: 4.07 MIL/uL — ABNORMAL LOW (ref 4.22–5.81)
RDW: 13.1 % (ref 11.5–15.5)
WBC: 13.3 K/uL — ABNORMAL HIGH (ref 4.0–10.5)
nRBC: 0 % (ref 0.0–0.2)

## 2024-06-28 LAB — URINE CULTURE
Culture: NO GROWTH
Special Requests: NORMAL

## 2024-06-28 MED ORDER — POTASSIUM CHLORIDE CRYS ER 20 MEQ PO TBCR
40.0000 meq | EXTENDED_RELEASE_TABLET | Freq: Once | ORAL | Status: AC
  Administered 2024-06-28: 40 meq via ORAL
  Filled 2024-06-28: qty 2

## 2024-06-28 NOTE — Progress Notes (Signed)
 Progress Note   Patient: Alfred Woodard FMW:[id number] DOB: [birth year] DOA: 06/26/2024     2 DOS: the patient was seen and examined on 06/28/2024     Brief hospital course:   From HPI Alfred Woodard is a 38 y.o. male with medical history significant of ADD, bipolar disorder, depression, chronic back pain, IV drug use, history of polysubstance use and erectile dysfunction presented emergency department complaining of worsening left-sided buttock abscess. States initially the abscess began as an ingrown hair that progressively worsened to a boil about 4 days ago. The boil ruptured 2 days ago after the patient attempted to drain it, and he states that his pain and his fevers began around then. His infection is spreading inward towards his perineum and left testicle. This is making it painful to defecate or urinate, and has not eaten much the last few days to reduce the need to use the restroom. His pain is a 10/10 and is sharp/throbbing in nature. States he has had fevers 99-100 degrees at home and at [facility] earlier today, although he is afebrile here. Has had SOB, chills, nausea and vomiting.   Patient reported that waves of pain down his left leg, dysuria, painful defecation, and swollen glands near the scrotum.     Assessment and Plan: Perineal abscess Sepsis secondary to abscess Presented with tachycardia and temperature up to 100.4 Patient is status post abscess drainage on 7/25 and 06/27/2024 Presented to emergency department complaining of worsening left buttock abscess, low-grade fever at home, chills, dysuria, painful defecation and erection CT abdomen pelvis showed 7.8 cm multiloculated abscess in the inferior left perineum  General surgery on board and case discussed Continue IV vancomycin  and Zosyn .   Hypokalemia-continue repletion and monitoring  History of IV drug use and polysubstance abuse -Patient has history of IV drug use.   HIV test negative, hepatitis B and C negative    DVT prophylaxis:  SCDs   Code Status:  Full Code   Disposition Plan: Pending culture results, medical stabilization and clearance by general surgery before discharge   Consults: General Surgery   Admission status:   Inpatient     Family Communication: None present at bedside   Time spent:39 minutes   Subjective:  Patient seen and examined at bedside this morning With some mild leukocytosis and hypokalemia He tells me the pain in the perineal region is better today than yesterday Denies fever chest pain abdominal pain or cough   Physical Exam: Vitals and nursing note reviewed.  General: Young male laying in bed in no acute distress Pulmonary:     Effort: Pulmonary effort is normal.     Breath sounds: Normal breath sounds.  Abdominal    Palpations: Abdomen is soft.  Musculoskeletal:     Cervical back: Normal range of motion and neck supple.  Skin: Dressing noted to the perineal region appears clean and dry    General: Skin is dry.     Capillary Refill: Capillary refill takes less than 2 seconds.  Neurological:     Mental Status: He is alert and oriented to person, place, and time.  Psychiatric:        Mood and Affect: Mood normal.        Thought Content: Thought content normal.    Data Reviewed: Culture results growing gram-positive organisms identification still pending    Latest Ref Rng & Units 06/28/2024    3:42 AM 06/27/2024    4:56 AM 06/26/2024    9:03 PM  CBC  WBC 4.0 - 10.5 K/uL 13.3  11.2  12.7   Hemoglobin 13.0 - 17.0 g/dL 87.4  88.2  86.3   Hematocrit 39.0 - 52.0 % 36.9  35.3  41.2   Platelets 150 - 400 K/uL 203  160  196        Latest Ref Rng & Units 06/28/2024    3:42 AM 06/27/2024    4:56 AM 06/26/2024    9:03 PM  BMP  Glucose 70 - 99 mg/dL 802  898  878   BUN 6 - 20 mg/dL 11  11  14    Creatinine 0.61 - 1.24 mg/dL 9.17  9.19  9.09   Sodium 135 - 145 mmol/L 136  136  134   Potassium 3.5 - 5.1 mmol/L 3.3  3.8  3.6   Chloride 98 - 111 mmol/L 102   104  98   CO2 22 - 32 mmol/L 24  25  24    Calcium 8.9 - 10.3 mg/dL 8.8  8.7  9.4       Vitals:   06/27/24 1718 06/27/24 1938 06/28/24 0403 06/28/24 1236  BP: 128/72 114/80 120/69 125/85  Pulse: 91 84 99 81  Resp: 12 18 18 14   Temp: 98.1 F (36.7 C) 98 F (36.7 C) 97.9 F (36.6 C) 98.1 F (36.7 C)  TempSrc: Oral Oral Oral Oral  SpO2: 100% 97% 99% 100%  Weight:      Height:        Author: Drue ONEIDA Potter, MD 06/28/2024 4:52 PM  For on call review [email or web address].

## 2024-06-28 NOTE — Progress Notes (Signed)
 1 Day Post-Op   Subjective/Chief Complaint: Still quite tender, but slightly improved from yesterday WBC remains elevated Afebrile  Objective: Vital signs in last 24 hours: Temp:  [97.5 F (36.4 C)-98.1 F (36.7 C)] 97.9 F (36.6 C) (07/27 0403) Pulse Rate:  [70-99] 99 (07/27 0403) Resp:  [10-24] 18 (07/27 0403) BP: (107-128)/(63-98) 120/69 (07/27 0403) SpO2:  [97 %-100 %] 99 % (07/27 0403) Last BM Date : 06/24/24  Intake/Output from previous day: 07/26 0701 - 07/27 0700 In: 1394.6 [P.O.:480; I.V.:647.4; IV Piggyback:267.3] Out: 1800 [Urine:1800] Intake/Output this shift: Total I/O In: -  Out: 800 [Urine:800]  Perineal abscess - both openings are draining some purulent drainage; Penrose intact Still with significant induration and tenderness  Lab Results:  Recent Labs    06/27/24 0456 06/28/24 0342  WBC 11.2* 13.3*  HGB 11.7* 12.5*  HCT 35.3* 36.9*  PLT 160 203   BMET Recent Labs    06/27/24 0456 06/28/24 0342  NA 136 136  K 3.8 3.3*  CL 104 102  CO2 25 24  GLUCOSE 101* 197*  BUN 11 11  CREATININE 0.80 0.82  CALCIUM 8.7* 8.8*   PT/INR No results for input(s): LABPROT, INR in the last 72 hours. ABG No results for input(s): PHART, HCO3 in the last 72 hours.  Invalid input(s): PCO2, PO2  Studies/Results: CT ABDOMEN PELVIS W CONTRAST Result Date: 06/26/2024 CLINICAL DATA:  Evaluate for buttocks or perineal abscess. EXAM: CT ABDOMEN AND PELVIS WITH CONTRAST TECHNIQUE: Multidetector CT imaging of the abdomen and pelvis was performed using the standard protocol following bolus administration of intravenous contrast. RADIATION DOSE REDUCTION: This exam was performed according to the departmental dose-optimization program which includes automated exposure control, adjustment of the mA and/or kV according to patient size and/or use of iterative reconstruction technique. CONTRAST:  OMNIPAQUE  IOHEXOL  300 MG/ML  SOLN COMPARISON:  None Available.  FINDINGS: Lower chest: No acute abnormality. Hepatobiliary: No focal liver abnormality is seen. No gallstones, gallbladder wall thickening, or biliary dilatation. Pancreas: Unremarkable. No pancreatic ductal dilatation or surrounding inflammatory changes. Spleen: Mildly enlarged. Adrenals/Urinary Tract: Adrenal glands are unremarkable. Kidneys are normal, without renal calculi, focal lesion, or hydronephrosis. Bladder is unremarkable. Stomach/Bowel: Stomach is within normal limits. Appendix appears normal. No evidence of bowel wall thickening, distention, or inflammatory changes. 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 Aorta and IVC are normal in size. No enlarged lymph nodes are identified. There some prominent left inguinal lymph nodes. Reproductive: Prostate is unremarkable. Other: Enhancing multiloculated fluid collection is seen in the inferior left perineum with surrounding inflammatory stranding measuring 7.8 x 3.3 x 3.8 cm. There is associated skin thickening. There is no ascites. There is no free air or focal abdominal wall hernia. Musculoskeletal: No fracture is seen. IMPRESSION: 1. 7.8 cm multiloculated abscess in the inferior left perineum with surrounding inflammatory stranding and skin thickening. 2. Prominent left inguinal lymph nodes, likely reactive. 3. Mild splenomegaly with splenic varices.  Is Electronically Signed   By: Greig Pique M.D.   On: 06/26/2024 22:06    Anti-infectives: Anti-infectives (From admission, onward)    Start     Dose/Rate Route Frequency Ordered Stop   06/27/24 1000  vancomycin  (VANCOREADY) IVPB 1250 mg/250 mL        1,250 mg 166.7 mL/hr over 90 Minutes Intravenous Every 12 hours 06/26/24 2258     06/27/24 0600  piperacillin -tazobactam (ZOSYN ) IVPB 3.375 g        3.375 g 12.5 mL/hr over 240 Minutes Intravenous Every 8 hours  06/26/24 2253     06/26/24 2045  clindamycin  (CLEOCIN ) IVPB 600 mg        600 mg 100 mL/hr over 30 Minutes Intravenous  Once  06/26/24 2035 06/26/24 2236   06/26/24 2045  vancomycin  (VANCOREADY) IVPB 1500 mg/300 mL        1,500 mg 150 mL/hr over 120 Minutes Intravenous  Once 06/26/24 2037 06/27/24 0027   06/26/24 2045  piperacillin -tazobactam (ZOSYN ) IVPB 3.375 g        3.375 g 100 mL/hr over 30 Minutes Intravenous  Once 06/26/24 2043 06/26/24 2215       Assessment/Plan: Perineal abscess s/p I&D/ Penrose drain 7/26 Alfred Woodard Continue IV antibiotics Dry dressings to absorb drainage from Penrose drain  Alfred K. Belinda, MD, [facility] Surgery  General Surgery   06/28/2024 9:48 AM   LOS: 2 days    Alfred Woodard 06/28/2024

## 2024-06-28 NOTE — TOC Initial Note (Signed)
 Transition of Care [facility]) - Initial/Assessment Note    Patient Details  Name: Alfred Woodard MRN: [id number] Date of Birth: 10/19/1986  Transition of [facility]) CM/SW Contact:    Sonda Manuella Quill, RN Phone Number: 06/28/2024, 12:42 PM  Clinical Narrative:                 No PCP listed; spoke w/ pt in room; pt says he lives at home w/ his fiance'; he plans to return at d/c; pt identified POC mother Luke Counter ([phone]); family will provide transportation; pt verified insurance, and he does not have PCP; pt does not have DME, HH services, or home oxygen; pt has [employer]; he was advised to contact insurance for assigned provider, or list of in-network providers; pt verbalized understanding and will make his own PCP appt; TOC following.  Expected Discharge Plan: Home/Self Care Barriers to Discharge: Continued Medical Work up   Patient Goals and CMS Choice Patient states their goals for this hospitalization and ongoing recovery are:: home          Expected Discharge Plan and Services   Discharge Planning Services: CM Consult   Living arrangements for the past 2 months: Single Family Home                                      Prior Living Arrangements/Services Living arrangements for the past 2 months: Single Family Home Lives with:: Significant Other Patient language and need for interpreter reviewed:: Yes Do you feel safe going back to the place where you live?: Yes      Need for Family Participation in Patient Care: Yes (Comment) Care giver support system in place?: Yes (comment) Current home services:  (n/a) Criminal Activity/Legal Involvement Pertinent to Current Situation/Hospitalization: No - Comment as needed  Activities of Daily Living   ADL Screening (condition at time of admission) Independently performs ADLs?: Yes (appropriate for developmental age) Is the patient deaf or have difficulty hearing?: No Does the patient have difficulty seeing, even  when wearing glasses/contacts?: No Does the patient have difficulty concentrating, remembering, or making decisions?: No  Permission Sought/Granted Permission sought to share information with : Case Manager Permission granted to share information with : Yes, Verbal Permission Granted  Share Information with NAME: Case Manger     Permission granted to share info w Relationship: Luke Counter (mother) [phone]     Emotional Assessment Appearance:: Appears stated age Attitude/Demeanor/Rapport: Gracious Affect (typically observed): Accepting Orientation: : Oriented to Self, Oriented to Place, Oriented to  Time, Oriented to Situation Alcohol / Substance Use: Not Applicable Psych Involvement: No (comment)  Admission diagnosis:  Perineal abscess [L02.215] Patient Active Problem List   Diagnosis Date Noted   Sepsis (HCC) 06/26/2024   Perineal abscess 06/26/2024   History of IVDU (intravenous drug user) 06/26/2024   Visit for preventive health examination 04/28/2014   Hx of drug abuse (HCC) 04/28/2014   ADD (attention deficit disorder) 04/28/2014   Thrombocytopenia (HCC) 07/13/2012   Alcohol intoxication (HCC) 07/13/2012   BIPOLAR DISORDER UNSPECIFIED 03/25/2009   ERECTILE DYSFUNCTION 03/25/2009   TOBACCO ABUSE 03/25/2009   SYNCOPE 03/25/2009   PCP:  Patient, No Pcp Per Pharmacy:   [id number] [place], [place] - [address] AT [facility] [address] [place] [address] Phone: [phone] Fax: [phone]     Social  Drivers of Health (SDOH) Social History: SDOH Screenings   Food Insecurity: No Food Insecurity (06/28/2024)  Housing: Low Risk  (06/28/2024)  Transportation Needs: No Transportation Needs (06/28/2024)  Utilities: Not At Risk (06/28/2024)  Tobacco Use: High Risk (06/26/2024)   SDOH Interventions: Food Insecurity Interventions: Intervention Not Indicated, Inpatient TOC Housing Interventions:  Intervention Not Indicated, Inpatient TOC Transportation Interventions: Intervention Not Indicated, Inpatient TOC Utilities Interventions: Intervention Not Indicated, Inpatient TOC   Readmission Risk Interventions     No data to display

## 2024-06-29 DIAGNOSIS — L02215 Cutaneous abscess of perineum: Secondary | ICD-10-CM | POA: Diagnosis not present

## 2024-06-29 LAB — CBC WITH DIFFERENTIAL/PLATELET
Abs Immature Granulocytes: 0.05 K/uL (ref 0.00–0.07)
Basophils Absolute: 0.1 K/uL (ref 0.0–0.1)
Basophils Relative: 1 %
Eosinophils Absolute: 0.1 K/uL (ref 0.0–0.5)
Eosinophils Relative: 2 %
HCT: 39.6 % (ref 39.0–52.0)
Hemoglobin: 12.7 g/dL — ABNORMAL LOW (ref 13.0–17.0)
Immature Granulocytes: 1 %
Lymphocytes Relative: 27 %
Lymphs Abs: 2.4 K/uL (ref 0.7–4.0)
MCH: 29.1 pg (ref 26.0–34.0)
MCHC: 32.1 g/dL (ref 30.0–36.0)
MCV: 90.8 fL (ref 80.0–100.0)
Monocytes Absolute: 0.4 K/uL (ref 0.1–1.0)
Monocytes Relative: 4 %
Neutro Abs: 5.8 K/uL (ref 1.7–7.7)
Neutrophils Relative %: 65 %
Platelets: 220 K/uL (ref 150–400)
RBC: 4.36 MIL/uL (ref 4.22–5.81)
RDW: 13.4 % (ref 11.5–15.5)
WBC: 8.8 K/uL (ref 4.0–10.5)
nRBC: 0 % (ref 0.0–0.2)

## 2024-06-29 LAB — BASIC METABOLIC PANEL WITH GFR
Anion gap: 13 (ref 5–15)
BUN: 10 mg/dL (ref 6–20)
CO2: 20 mmol/L — ABNORMAL LOW (ref 22–32)
Calcium: 8.9 mg/dL (ref 8.9–10.3)
Chloride: 104 mmol/L (ref 98–111)
Creatinine, Ser: 0.72 mg/dL (ref 0.61–1.24)
GFR, Estimated: >60 mL/min (ref >60)
Glucose, Bld: 172 mg/dL — ABNORMAL HIGH (ref 70–99)
Potassium: 3.5 mmol/L (ref 3.5–5.1)
Sodium: 137 mmol/L (ref 135–145)

## 2024-06-29 NOTE — Progress Notes (Signed)
 Progress Note  2 Days Post-Op  Subjective: Patient reports continued pain of the left buttocks. Has not had a bowel movement. Has been urinating well. Tolerating regular diet.  ROS  All negative with exception of above  Objective: Vital signs in last 24 hours: Temp:  [97.5 F (36.4 C)-98.1 F (36.7 C)] 97.5 F (36.4 C) (07/28 0420) Pulse Rate:  [81-87] 81 (07/28 0420) Resp:  [14-15] 14 (07/28 0420) BP: (103-130)/(74-88) 130/88 (07/28 0420) SpO2:  [100 %] 100 % (07/28 0420) Last BM Date : 06/24/24  Intake/Output from previous day: 07/27 0701 - 07/28 0700 In: 60 [P.O.:60] Out: 2290 [Urine:2290] Intake/Output this shift: No intake/output data recorded.  PE: General: Pleasant, WD, male who is laying in bed in NAD. Lungs: Respiratory effort nonlabored GU: Left buttock wound present. Small amount of purulent drainage noted on dressing. Penrose drain present.Significant tenderness to palpation. No active bleeding. Improvement of erythema around wound when compared to previous photo.  Psych: A&Ox3 with an appropriate affect.    Lab Results:  Recent Labs    06/28/24 0342 06/29/24 0305  WBC 13.3* 8.8  HGB 12.5* 12.7*  HCT 36.9* 39.6  PLT 203 220   BMET Recent Labs    06/28/24 0342 06/29/24 0305  NA 136 137  K 3.3* 3.5  CL 102 104  CO2 24 20*  GLUCOSE 197* 172*  BUN 11 10  CREATININE 0.82 0.72  CALCIUM 8.8* 8.9   PT/INR No results for input(s): LABPROT, INR in the last 72 hours. CMP     Component Value Date/Time   NA 137 06/29/2024 0305   K 3.5 06/29/2024 0305   CL 104 06/29/2024 0305   CO2 20 (L) 06/29/2024 0305   GLUCOSE 172 (H) 06/29/2024 0305   BUN 10 06/29/2024 0305   CREATININE 0.72 06/29/2024 0305   CREATININE 0.88 04/23/2014 1117   CALCIUM 8.9 06/29/2024 0305   PROT 6.4 (L) 06/27/2024 0456   ALBUMIN 3.1 (L) 06/27/2024 0456   AST 23 06/27/2024 0456   ALT 33 06/27/2024 0456   ALKPHOS 56 06/27/2024 0456   BILITOT 0.9 06/27/2024 0456    GFRNONAA >60 06/29/2024 0305   GFRAA >90 07/13/2012 2025   Lipase  No results found for: LIPASE     Studies/Results: No results found.  Anti-infectives: Anti-infectives (From admission, onward)    Start     Dose/Rate Route Frequency Ordered Stop   06/27/24 1000  vancomycin  (VANCOREADY) IVPB 1250 mg/250 mL        1,250 mg 166.7 mL/hr over 90 Minutes Intravenous Every 12 hours 06/26/24 2258     06/27/24 0600  piperacillin -tazobactam (ZOSYN ) IVPB 3.375 g        3.375 g 12.5 mL/hr over 240 Minutes Intravenous Every 8 hours 06/26/24 2253     06/26/24 2045  clindamycin  (CLEOCIN ) IVPB 600 mg        600 mg 100 mL/hr over 30 Minutes Intravenous  Once 06/26/24 2035 06/26/24 2236   06/26/24 2045  vancomycin  (VANCOREADY) IVPB 1500 mg/300 mL        1,500 mg 150 mL/hr over 120 Minutes Intravenous  Once 06/26/24 2037 06/27/24 0027   06/26/24 2045  piperacillin -tazobactam (ZOSYN ) IVPB 3.375 g        3.375 g 100 mL/hr over 30 Minutes Intravenous  Once 06/26/24 2043 06/26/24 2215        Assessment/Plan Perineal abscess S/P I&D with penrose drain 06/27/2024 -Aerobic/Anaerobic Culture w Gram Stain: Preliminary results of gram stain show few WBC  present, predominantly PMN few gram positive cocci. Preliminary results of culture show moderate staphylococcus aureus -WBC 8.8 -Continue IV antibiotics -Recommended sitz baths/peribottle and dressing changes. Discussed completing this with each bowel movement. Complete 2-3 times per day.   FEN: Regular diet; Sodium chloride  0.9% VTE: SCDs ID: Zosyn  and vancomycin     LOS: 3 days     Marjorie Carlyon Favre, [facility] Surgery 06/29/2024, 9:47 AM Please see Amion for pager number during day hours 7:00am-4:30pm

## 2024-06-29 NOTE — Progress Notes (Signed)
 Progress Note   Patient: Alfred Woodard FMW:[id number] DOB: Apr 22, 1986 DOA: 06/26/2024     3 DOS: the patient was seen and examined on 06/29/2024    Brief hospital course:   From HPI Alfred VUOLO is a 38 y.o. male with medical history significant of ADD, bipolar disorder, depression, chronic back pain, IV drug use, history of polysubstance use and erectile dysfunction presented emergency department complaining of worsening left-sided buttock abscess. States initially the abscess began as an ingrown hair that progressively worsened to a boil about 4 days ago. The boil ruptured 2 days ago after the patient attempted to drain it, and he states that his pain and his fevers began around then. His infection is spreading inward towards his perineum and left testicle. This is making it painful to defecate or urinate, and has not eaten much the last few days to reduce the need to use the restroom. His pain is a 10/10 and is sharp/throbbing in nature. States he has had fevers 99-100 degrees at home and at [facility] earlier today, although he is afebrile here. Has had SOB, chills, nausea and vomiting.   Patient reported that waves of pain down his left leg, dysuria, painful defecation, and swollen glands near the scrotum.     Assessment and Plan: Perineal abscess Sepsis secondary to abscess Presented with tachycardia and temperature up to 100.4 Patient is status post abscess drainage on 7/25 and 06/27/2024 Presented to emergency department complaining of worsening left buttock abscess, low-grade fever at home, chills, dysuria, painful defecation and erection CT abdomen pelvis showed 7.8 cm multiloculated abscess in the inferior left perineum  General surgery on board and case discussed Culture results growing Staphylococcus aureus sensitivity pending Continue IV vancomycin  and Zosyn .   Hypokalemia-continue repletion and monitoring   History of IV drug use and polysubstance abuse -Patient has history of  IV drug use.   HIV test negative, hepatitis B and C negative   DVT prophylaxis:  SCDs   Code Status:  Full Code   Disposition Plan: Pending culture results, medical stabilization and clearance by general surgery before discharge   Consults: General Surgery   Admission status:   Inpatient     Family Communication: None present at bedside   Time spent: 37 minutes   Subjective:  Patient seen and examined at bedside this morning He tells me he feels lethargic but improving Denies chest pain abdominal pain nausea vomiting or worsening perineal pain   Physical Exam: Vitals and nursing note reviewed.  General: Young male laying in bed in no acute distress Pulmonary:     Effort: Pulmonary effort is normal.     Breath sounds: Normal breath sounds.  Abdominal    Palpations: Abdomen is soft.  Musculoskeletal:     Cervical back: Normal range of motion and neck supple.  Skin: Dressing noted to the perineal region appears clean and dry    General: Skin is dry.     Capillary Refill: Capillary refill takes less than 2 seconds.  Neurological:     Mental Status: He is alert and oriented to person, place, and time.  Psychiatric:        Mood and Affect: Mood normal.        Thought Content: Thought content normal.    Data Reviewed: Culture results growing staph aureus    Latest Ref Rng & Units 06/29/2024    3:05 AM 06/28/2024    3:42 AM 06/27/2024    4:56 AM  CBC  WBC 4.0 -  10.5 K/uL 8.8  13.3  11.2   Hemoglobin 13.0 - 17.0 g/dL 87.2  87.4  88.2   Hematocrit 39.0 - 52.0 % 39.6  36.9  35.3   Platelets 150 - 400 K/uL 220  203  160        Latest Ref Rng & Units 06/29/2024    3:05 AM 06/28/2024    3:42 AM 06/27/2024    4:56 AM  BMP  Glucose 70 - 99 mg/dL 827  802  898   BUN 6 - 20 mg/dL 10  11  11    Creatinine 0.61 - 1.24 mg/dL 9.27  9.17  9.19   Sodium 135 - 145 mmol/L 137  136  136   Potassium 3.5 - 5.1 mmol/L 3.5  3.3  3.8   Chloride 98 - 111 mmol/L 104  102  104   CO2 22 - 32  mmol/L 20  24  25    Calcium 8.9 - 10.3 mg/dL 8.9  8.8  8.7     Vitals:   06/28/24 0403 06/28/24 1236 06/28/24 2021 06/29/24 0420  BP: 120/69 125/85 103/74 130/88  Pulse: 99 81 87 81  Resp: 18 14 15 14   Temp: 97.9 F (36.6 C) 98.1 F (36.7 C) 97.8 F (36.6 C) (!) 97.5 F (36.4 C)  TempSrc: Oral Oral Oral Oral  SpO2: 99% 100% 100% 100%  Weight:      Height:        Author: Drue ONEIDA Potter, MD 06/29/2024 4:43 PM  For on call review [email or web address].

## 2024-06-30 DIAGNOSIS — L02215 Cutaneous abscess of perineum: Secondary | ICD-10-CM | POA: Diagnosis not present

## 2024-06-30 LAB — CBC WITH DIFFERENTIAL/PLATELET
Abs Immature Granulocytes: 0.1 K/uL — ABNORMAL HIGH (ref 0.00–0.07)
Basophils Absolute: 0.1 K/uL (ref 0.0–0.1)
Basophils Relative: 1 %
Eosinophils Absolute: 0.2 K/uL (ref 0.0–0.5)
Eosinophils Relative: 2 %
HCT: 41.6 % (ref 39.0–52.0)
Hemoglobin: 13.8 g/dL (ref 13.0–17.0)
Immature Granulocytes: 1 %
Lymphocytes Relative: 27 %
Lymphs Abs: 3.1 K/uL (ref 0.7–4.0)
MCH: 30.3 pg (ref 26.0–34.0)
MCHC: 33.2 g/dL (ref 30.0–36.0)
MCV: 91.4 fL (ref 80.0–100.0)
Monocytes Absolute: 0.9 K/uL (ref 0.1–1.0)
Monocytes Relative: 7 %
Neutro Abs: 7.5 K/uL (ref 1.7–7.7)
Neutrophils Relative %: 62 %
Platelets: 262 K/uL (ref 150–400)
RBC: 4.55 MIL/uL (ref 4.22–5.81)
RDW: 13.4 % (ref 11.5–15.5)
WBC: 11.8 K/uL — ABNORMAL HIGH (ref 4.0–10.5)
nRBC: 0 % (ref 0.0–0.2)

## 2024-06-30 LAB — BASIC METABOLIC PANEL WITH GFR
Anion gap: 8 (ref 5–15)
BUN: 12 mg/dL (ref 6–20)
CO2: 27 mmol/L (ref 22–32)
Calcium: 9 mg/dL (ref 8.9–10.3)
Chloride: 103 mmol/L (ref 98–111)
Creatinine, Ser: 0.95 mg/dL (ref 0.61–1.24)
GFR, Estimated: >60 mL/min (ref >60)
Glucose, Bld: 107 mg/dL — ABNORMAL HIGH (ref 70–99)
Potassium: 3.6 mmol/L (ref 3.5–5.1)
Sodium: 138 mmol/L (ref 135–145)

## 2024-06-30 MED ORDER — OXYCODONE HCL 5 MG PO TABS: 5.0000 mg | ORAL_TABLET | ORAL | 0 refills | Status: AC | PRN

## 2024-06-30 MED ORDER — DOXYCYCLINE HYCLATE 100 MG PO TABS: 100.0000 mg | ORAL_TABLET | Freq: Two times a day (BID) | ORAL | 0 refills | Status: AC

## 2024-06-30 MED ORDER — SENNOSIDES-DOCUSATE SODIUM 8.6-50 MG PO TABS: 1.0000 | ORAL_TABLET | Freq: Two times a day (BID) | ORAL | 0 refills | Status: AC

## 2024-06-30 NOTE — Plan of Care (Signed)

## 2024-06-30 NOTE — Discharge Summary (Signed)
 Physician Discharge Summary   Patient: Alfred Woodard MRN: [id number] DOB: 29-Feb-1986  Admit date:     06/26/2024  Discharge date: 06/30/24  Discharge Physician: Drue ONEIDA Potter   PCP: Patient, No Pcp Per   Recommendations at discharge:  Follow-up with surgery   Discharge Diagnoses: Perineal abscess Sepsis secondary to abscess Hypokalemia-continue repletion and monitoring History of IV drug use and polysubstance abuse  Hospital Course: Alfred Woodard is a 38 y.o. male with medical history significant of ADD, bipolar disorder, depression, chronic back pain, IV drug use, history of polysubstance use and erectile dysfunction presented emergency department complaining of worsening left-sided buttock abscess. States initially the abscess began as an ingrown hair that progressively worsened to a boil about 4 days ago. The boil ruptured 2 days for presentation after the patient attempted to drain it, and he states that his pain and his fevers began around then.  Patient was seen on admission by general surgery underwent incision and drainage.  Culture results growing MRSA in the wound.  Sensitive to tetracycline.  Patient has been cleared by general surgery for discharge today and will follow-up with them as an outpatient on oral antibiotic therapy. Patient will also follow-up with general surgery's instruction concerning wound care management.  Consultants: General Surgery Procedures performed: As mentioned above Disposition: Home Diet recommendation:  Regular diet DISCHARGE MEDICATION: Allergies as of 06/30/2024       Reactions   Cefaclor Other (See Comments)   childhood        Medication List     STOP taking these medications    benzonatate  100 MG capsule Commonly known as: TESSALON    ibuprofen  800 MG tablet Commonly known as: ADVIL        TAKE these medications    acetaminophen  500 MG tablet Commonly known as: TYLENOL  Take 500 mg by mouth every 6 (six) hours as needed.    doxycycline  100 MG tablet Commonly known as: VIBRA -TABS Take 1 tablet (100 mg total) by mouth 2 (two) times daily for 10 days.   oxyCODONE  5 MG immediate release tablet Commonly known as: Oxy IR/ROXICODONE  Take 1-2 tablets (5-10 mg total) by mouth every 4 (four) hours as needed for up to 3 days for moderate pain (pain score 4-6) or severe pain (pain score 7-10) (5 moderate, 10 severe).   senna-docusate 8.6-50 MG tablet Commonly known as: Senokot-S Take 1 tablet by mouth 2 (two) times daily.        Follow-up Information     Maczis, Puja Gosai, PA-C. Go on 07/14/2024.   Specialty: General Surgery Why: Appointment at 1:45., Arrive 30 mins prior to scheduled appointment time Contact information: [address] [facility] [address] [place] [address] [phone]                Discharge Exam: Fredricka Weights   06/26/24 1949  Weight: 70.3 kg   Vitals and nursing note reviewed.  General: Young male laying in bed in no acute distress Pulmonary:     Effort: Pulmonary effort is normal.     Breath sounds: Normal breath sounds.  Abdominal    Palpations: Abdomen is soft.  Musculoskeletal:     Cervical back: Normal range of motion and neck supple.  Skin: Dressing noted to the perineal region appears clean and dry, dressing was changed yesterday by general surgery team Neurological:     Mental Status: He is alert and oriented to person, place, and time.  Psychiatric:  Mood and Affect: Mood normal.        Thought Content: Thought content normal.   Condition at discharge: good  The results of significant diagnostics from this hospitalization (including imaging, microbiology, ancillary and laboratory) are listed below for reference.   Imaging Studies: CT ABDOMEN PELVIS W CONTRAST Result Date: 06/26/2024 CLINICAL DATA:  Evaluate for buttocks or perineal abscess. EXAM: CT ABDOMEN AND PELVIS WITH CONTRAST TECHNIQUE: Multidetector CT imaging of the  abdomen and pelvis was performed using the standard protocol following bolus administration of intravenous contrast. RADIATION DOSE REDUCTION: This exam was performed according to the departmental dose-optimization program which includes automated exposure control, adjustment of the mA and/or kV according to patient size and/or use of iterative reconstruction technique. CONTRAST:  OMNIPAQUE  IOHEXOL  300 MG/ML  SOLN COMPARISON:  None Available. FINDINGS: Lower chest: No acute abnormality. Hepatobiliary: No focal liver abnormality is seen. No gallstones, gallbladder wall thickening, or biliary dilatation. Pancreas: Unremarkable. No pancreatic ductal dilatation or surrounding inflammatory changes. Spleen: Mildly enlarged. Adrenals/Urinary Tract: Adrenal glands are unremarkable. Kidneys are normal, without renal calculi, focal lesion, or hydronephrosis. Bladder is unremarkable. Stomach/Bowel: Stomach is within normal limits. Appendix appears normal. No evidence of bowel wall thickening, distention, or inflammatory changes. 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 Aorta and IVC are normal in size. No enlarged lymph nodes are identified. There some prominent left inguinal lymph nodes. Reproductive: Prostate is unremarkable. Other: Enhancing multiloculated fluid collection is seen in the inferior left perineum with surrounding inflammatory stranding measuring 7.8 x 3.3 x 3.8 cm. There is associated skin thickening. There is no ascites. There is no free air or focal abdominal wall hernia. Musculoskeletal: No fracture is seen. IMPRESSION: 1. 7.8 cm multiloculated abscess in the inferior left perineum with surrounding inflammatory stranding and skin thickening. 2. Prominent left inguinal lymph nodes, likely reactive. 3. Mild splenomegaly with splenic varices.  Is Electronically Signed   By: Greig Pique M.D.   On: 06/26/2024 22:06    Microbiology: Results for orders placed or performed during the hospital  encounter of 06/26/24  Culture, blood (routine x 2)     Status: None (Preliminary result)   Collection Time: 06/26/24  9:03 PM   Specimen: BLOOD LEFT FOREARM  Result Value Ref Range Status   Specimen Description   Final    BLOOD LEFT FOREARM Performed at [facility], [address], [place], [place] [address]    Special Requests   Final    BOTTLES DRAWN AEROBIC AND ANAEROBIC Blood Culture results may not be optimal due to an inadequate volume of blood received in culture bottles Performed at [facility], [address] [place], [place] [address]    Culture   Final    NO GROWTH 3 DAYS Performed at [facility], [address], [place], [place] [address]    Report Status PENDING  Incomplete  Culture, blood (routine x 2)     Status: None (Preliminary result)   Collection Time: 06/26/24 10:12 PM   Specimen: BLOOD RIGHT WRIST  Result Value Ref Range Status   Specimen Description   Final    BLOOD RIGHT WRIST Performed at [facility], [address], [place], [place] [address]    Special Requests   Final    BOTTLES DRAWN AEROBIC AND ANAEROBIC Blood Culture adequate volume Performed at [facility], [address] [place], [place] [address]    Culture   Final    NO GROWTH 3  DAYS Performed at [facility], [address] [place], [place] [address]    Report Status PENDING  Incomplete  Aerobic/Anaerobic Culture w Gram Stain (surgical/deep wound)     Status: None (Preliminary result)   Collection Time: 06/26/24 11:24 PM   Specimen: Perineal; Abscess  Result Value Ref Range Status   Specimen Description   Final    PERINEAL Performed at [facility], [address], [place], [place] [address]    Special Requests   Final    NONE Performed at [facility], [address] [place], [place] [address]    Gram Stain   Final    FEW WBC PRESENT, PREDOMINANTLY  PMN FEW GRAM POSITIVE COCCI Performed at [facility], [address], [place], [place] [address]    Culture   Final    MODERATE METHICILLIN RESISTANT STAPHYLOCOCCUS AUREUS   Report Status PENDING  Incomplete   Organism ID, Bacteria METHICILLIN RESISTANT STAPHYLOCOCCUS AUREUS  Final      Susceptibility   Methicillin resistant staphylococcus aureus - MIC*    CIPROFLOXACIN >=8 RESISTANT Resistant     ERYTHROMYCIN >=8 RESISTANT Resistant     GENTAMICIN <=0.5 SENSITIVE Sensitive     OXACILLIN >=4 RESISTANT Resistant     TETRACYCLINE <=1 SENSITIVE Sensitive     VANCOMYCIN  1 SENSITIVE Sensitive     TRIMETH/SULFA >=320 RESISTANT Resistant     CLINDAMYCIN  <=0.25 SENSITIVE Sensitive     RIFAMPIN <=0.5 SENSITIVE Sensitive     Inducible Clindamycin  NEGATIVE Sensitive     LINEZOLID 2 SENSITIVE Sensitive     * MODERATE METHICILLIN RESISTANT STAPHYLOCOCCUS AUREUS  Urine Culture (for pregnant, neutropenic or urologic patients or patients with an indwelling urinary catheter)     Status: None   Collection Time: 06/27/24  2:03 AM   Specimen: Urine, Clean Catch  Result Value Ref Range Status   Specimen Description   Final    URINE, CLEAN CATCH Performed at [facility], [address] [place], [place] [address]    Special Requests   Final    Normal Performed at [facility], [address] [place], [place] [address]    Culture   Final    NO GROWTH Performed at [facility], [address] [place], [place] [address]    Report Status 06/28/2024 FINAL  Final    Labs: CBC: Recent Labs  Lab 06/26/24 2103 06/27/24 0456 06/28/24 0342 06/29/24 0305 06/30/24 0231  WBC 12.7* 11.2* 13.3* 8.8 11.8*  NEUTROABS 9.8*  --  10.7* 5.8 7.5  HGB 13.6 11.7* 12.5* 12.7* 13.8  HCT 41.2 35.3* 36.9* 39.6 41.6  MCV 92.6 92.4 90.7 90.8 91.4  PLT 196 160 203 220 262   Basic Metabolic Panel: Recent Labs  Lab 06/26/24 2103 06/27/24 0456 06/28/24 0342  06/29/24 0305 06/30/24 0231  NA 134* 136 136 137 138  K 3.6 3.8 3.3* 3.5 3.6  CL 98 104 102 104 103  CO2 24 25 24  20* 27  GLUCOSE 121* 101* 197* 172* 107*  BUN 14 11 11 10 12   CREATININE 0.90 0.80 0.82 0.72 0.95  CALCIUM 9.4 8.7* 8.8* 8.9 9.0   Liver Function Tests: Recent Labs  Lab 06/27/24 0456  AST 23  ALT 33  ALKPHOS 56  BILITOT 0.9  PROT 6.4*  ALBUMIN 3.1*   CBG: No results for input(s): GLUCAP in the last 168 hours.  Discharge time spent:  36 minutes.  Signed:  Drue ONEIDA Potter, MD [facility] 06/30/2024

## 2024-06-30 NOTE — Progress Notes (Signed)
 Discharge instructions given to patient and s/o questions asked and answered D Johnie RN

## 2024-06-30 NOTE — Plan of Care (Signed)
 All medical needs addressed Pt discharged   Problem: Clinical Measurements: Goal: Ability to maintain clinical measurements within normal limits will improve Outcome: Adequate for Discharge   Problem: Activity: Goal: Risk for activity intolerance will decrease Outcome: Adequate for Discharge   Problem: Education: Goal: Knowledge of General Education information will improve Description: Including pain rating scale, medication(s)/side effects and non-pharmacologic comfort measures Outcome: Adequate for Discharge

## 2024-06-30 NOTE — Progress Notes (Signed)
 Progress Note  3 Days Post-Op  Subjective: Patient reports improvement in pain around area of incisions. Denies fevers. Reports bowel movements and has been urinating well. Tolerating regular diet.  ROS  All negative with the exception of above.  Objective: Vital signs in last 24 hours: Temp:  [97.8 F (36.6 C)-98.2 F (36.8 C)] 97.8 F (36.6 C) (07/29 0521) Pulse Rate:  [70-99] 70 (07/29 0521) Resp:  [18] 18 (07/29 0521) BP: (121-124)/(85-90) 124/90 (07/29 0521) SpO2:  [98 %-100 %] 100 % (07/29 0521) Last BM Date : 06/29/24  Intake/Output from previous day: 07/28 0701 - 07/29 0700 In: 840 [P.O.:840] Out: -  Intake/Output this shift: No intake/output data recorded.  PE: General: Pleasant, WD, male who is laying in bed in NAD. Heart: Regular, rate, and rhythm. Lungs: Respiratory effort nonlabored GU: Left buttock wound present with penrose drain in place. Minimal purulent drainage noted on dressing. No purulent discharge manually expressed. Tenderness to palpation. No active bleeding. Erythema noted around incision sites without concern of worsening infection. Psych: A&Ox3 with an appropriate affect.    Lab Results:  Recent Labs    06/29/24 0305 06/30/24 0231  WBC 8.8 11.8*  HGB 12.7* 13.8  HCT 39.6 41.6  PLT 220 262   BMET Recent Labs    06/29/24 0305 06/30/24 0231  NA 137 138  K 3.5 3.6  CL 104 103  CO2 20* 27  GLUCOSE 172* 107*  BUN 10 12  CREATININE 0.72 0.95  CALCIUM 8.9 9.0   PT/INR No results for input(s): LABPROT, INR in the last 72 hours. CMP     Component Value Date/Time   NA 138 06/30/2024 0231   K 3.6 06/30/2024 0231   CL 103 06/30/2024 0231   CO2 27 06/30/2024 0231   GLUCOSE 107 (H) 06/30/2024 0231   BUN 12 06/30/2024 0231   CREATININE 0.95 06/30/2024 0231   CREATININE 0.88 04/23/2014 1117   CALCIUM 9.0 06/30/2024 0231   PROT 6.4 (L) 06/27/2024 0456   ALBUMIN 3.1 (L) 06/27/2024 0456   AST 23 06/27/2024 0456   ALT 33  06/27/2024 0456   ALKPHOS 56 06/27/2024 0456   BILITOT 0.9 06/27/2024 0456   GFRNONAA >60 06/30/2024 0231   GFRAA >90 07/13/2012 2025   Lipase  No results found for: LIPASE     Studies/Results: No results found.  Anti-infectives: Anti-infectives (From admission, onward)    Start     Dose/Rate Route Frequency Ordered Stop   06/27/24 1000  vancomycin  (VANCOREADY) IVPB 1250 mg/250 mL        1,250 mg 166.7 mL/hr over 90 Minutes Intravenous Every 12 hours 06/26/24 2258     06/27/24 0600  piperacillin -tazobactam (ZOSYN ) IVPB 3.375 g        3.375 g 12.5 mL/hr over 240 Minutes Intravenous Every 8 hours 06/26/24 2253     06/26/24 2045  clindamycin  (CLEOCIN ) IVPB 600 mg        600 mg 100 mL/hr over 30 Minutes Intravenous  Once 06/26/24 2035 06/26/24 2236   06/26/24 2045  vancomycin  (VANCOREADY) IVPB 1500 mg/300 mL        1,500 mg 150 mL/hr over 120 Minutes Intravenous  Once 06/26/24 2037 06/27/24 0027   06/26/24 2045  piperacillin -tazobactam (ZOSYN ) IVPB 3.375 g        3.375 g 100 mL/hr over 30 Minutes Intravenous  Once 06/26/24 2043 06/26/24 2215        Assessment/Plan Perineal abscess S/P I&D with penrose drain 06/27/2024  -Aerobic/Anaerobic  Culture w Gram Stain: Preliminary results of gram stain show few WBC present, predominantly PMN few gram positive cocci. Preliminary results of culture show moderate methicillin resistant staphylococcus aureus -WBC 11.8 from 8.8 (7/28) -Exam stable -Can transition to PO. Advise 14 day course at discharge. -Emphasized good hygiene and continuing sitz baths/peribottle and dressing changes. Reviewed completing this with each bowel movement. Complete 2-3 times per day. -Stable for discharge from surgical standpoint. Will arrange outpatient follow up for patient to our office. Please reach out with any questions or concerns.  FEN: Sodium chloride  flushes PRN; Regular diet VTE: SCDs ID: [email or web address]  and vancomycin ; Okay to transition to PO  antibiotics.    LOS: 4 days   I reviewed hospitalist notes, last 24 h vitals and pain scores, last 48 h intake and output, last 24 h labs and trends, and last 24 h imaging results.   Marjorie Carlyon Favre, [facility] Surgery 06/30/2024, 9:47 AM Please see Amion for pager number during day hours 7:00am-4:30pm

## 2024-07-02 LAB — AEROBIC/ANAEROBIC CULTURE W GRAM STAIN (SURGICAL/DEEP WOUND)

## 2024-07-02 LAB — CULTURE, BLOOD (ROUTINE X 2)
Culture: NO GROWTH
Culture: NO GROWTH
Special Requests: ADEQUATE

## 2024-10-26 ENCOUNTER — Emergency Department (HOSPITAL_COMMUNITY): Payer: MEDICAID

## 2024-10-26 ENCOUNTER — Encounter (HOSPITAL_COMMUNITY): Payer: Self-pay

## 2024-10-26 ENCOUNTER — Emergency Department (HOSPITAL_COMMUNITY)
Admission: EM | Admit: 2024-10-26 | Discharge: 2024-10-26 | Disposition: A | Discharge status: Home / Self Care | Payer: MEDICAID | Attending: Emergency Medicine | Admitting: Emergency Medicine

## 2024-10-26 ENCOUNTER — Other Ambulatory Visit: Payer: Self-pay

## 2024-10-26 DIAGNOSIS — S0993XA Unspecified injury of face, initial encounter: Secondary | ICD-10-CM | POA: Diagnosis present

## 2024-10-26 DIAGNOSIS — R519 Headache, unspecified: Secondary | ICD-10-CM | POA: Diagnosis not present

## 2024-10-26 DIAGNOSIS — S00512A Abrasion of oral cavity, initial encounter: Secondary | ICD-10-CM | POA: Diagnosis not present

## 2024-10-26 DIAGNOSIS — Y92410 Unspecified street and highway as the place of occurrence of the external cause: Secondary | ICD-10-CM | POA: Diagnosis not present

## 2024-10-26 DIAGNOSIS — Z79899 Other long term (current) drug therapy: Secondary | ICD-10-CM | POA: Insufficient documentation

## 2024-10-26 LAB — CBG MONITORING, ED: Glucose-Capillary: 118 mg/dL — ABNORMAL HIGH (ref 70–99)
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
